# Patient Record
Sex: Female | Born: 1985 | Race: White | Hispanic: No | Marital: Married | Smoking: Never smoker
Health system: Southern US, Community
[De-identification: ages and names within clinical notes are randomized; demographics above are authoritative.]

## PROBLEM LIST (undated history)

## (undated) ENCOUNTER — Inpatient Hospital Stay (HOSPITAL_COMMUNITY): Payer: Self-pay

## (undated) DIAGNOSIS — F32A Depression, unspecified: Secondary | ICD-10-CM

## (undated) DIAGNOSIS — F419 Anxiety disorder, unspecified: Secondary | ICD-10-CM

## (undated) DIAGNOSIS — Z8619 Personal history of other infectious and parasitic diseases: Secondary | ICD-10-CM

## (undated) DIAGNOSIS — F329 Major depressive disorder, single episode, unspecified: Secondary | ICD-10-CM

## (undated) HISTORY — PX: NO PAST SURGERIES: SHX2092

## (undated) HISTORY — DX: Major depressive disorder, single episode, unspecified: F32.9

## (undated) HISTORY — DX: Personal history of other infectious and parasitic diseases: Z86.19

## (undated) HISTORY — DX: Anxiety disorder, unspecified: F41.9

## (undated) HISTORY — DX: Depression, unspecified: F32.A

---

## 2014-02-17 ENCOUNTER — Ambulatory Visit (INDEPENDENT_AMBULATORY_CARE_PROVIDER_SITE_OTHER): Payer: BC Managed Care – PPO | Admitting: Internal Medicine

## 2014-02-17 VITALS — BP 96/54 | HR 73 | Temp 97.9°F | Resp 16 | Ht 68.0 in | Wt 118.6 lb

## 2014-02-17 DIAGNOSIS — F39 Unspecified mood [affective] disorder: Secondary | ICD-10-CM

## 2014-02-17 DIAGNOSIS — Z79899 Other long term (current) drug therapy: Secondary | ICD-10-CM

## 2014-02-17 MED ORDER — TRAZODONE HCL 50 MG PO TABS: 50.0000 mg | ORAL_TABLET | Freq: Every day | ORAL | Status: DC

## 2014-02-17 MED ORDER — CITALOPRAM HYDROBROMIDE 20 MG PO TABS: 20.0000 mg | ORAL_TABLET | Freq: Every day | ORAL | Status: DC

## 2014-02-17 NOTE — Patient Instructions (Signed)
Adjustment Disorder Most changes in life can cause stress. Getting used to changes may take a few months or longer. If feelings of stress, hopelessness, or worry continue, you may have an adjustment disorder. This stress-related mental health problem may affect your feelings, thinking and how you act. It occurs in both sexes and happens at any age. SYMPTOMS  Some of the following problems may be seen and vary from person to person:  Sadness or depression.  Loss of enjoyment.  Thoughts of suicide.  Fighting.  Avoiding family and friends.  Poor school performance.  Hopelessness, sense of loss.  Trouble sleeping.  Vandalism.  Worry, weight loss or gain.  Crying spells.  Anxiety  Reckless driving.  Skipping school.  Poor work Systems analyst.  Nervousness.  Ignoring bills.  Poor attitude. DIAGNOSIS  Your caregiver will ask what has happened in your life and do a physical exam. They will make a diagnosis of an adjustment disorder when they are sure another problem or medical illness causing your feelings does not exist. TREATMENT  When problems caused by stress interfere with you daily life or last longer than a few months, you may need counseling for an adjustment disorder. Early treatment may diminish problems and help you to better cope with the stressful events in your life. Sometimes medication is necessary. Individual counseling and or support groups can be very helpful. PROGNOSIS  Adjustment disorders usually last less than 3 to 6 months. The condition may persist if there is long lasting stress. This could include health problems, relationship problems, or job difficulties where you can not easily escape from what is causing the problem. PREVENTION  Even the most mentally healthy, highly functioning people can suffer from an adjustment disorder given a significant blow from a life-changing event. There is no way to prevent pain and loss. Most people need help from time  to time. You are not alone. SEEK MEDICAL CARE IF:  Your feelings or symptoms listed above do not improve or worsen. Document Released: 01/18/2006 Document Revised: 08/08/2011 Document Reviewed: 04/11/2007 [facility] Patient Information 2015 [place], [place]. This information is not intended to replace advice given to you by your health care provider. Make sure you discuss any questions you have with your health care provider. Stress Stress-related medical problems are becoming increasingly common. The body has a built-in physical response to stressful situations. Faced with pressure, challenge or danger, we need to react quickly. Our bodies release hormones such as cortisol and adrenaline to help do this. These hormones are part of the "fight or flight" response and affect the metabolic rate, heart rate and blood pressure, resulting in a heightened, stressed state that prepares the body for optimum performance in dealing with a stressful situation. It is likely that early man required these mechanisms to stay alive, but usually modern stresses do not call for this, and the same hormones released in today's world can damage health and reduce coping ability. CAUSES  Pressure to perform at work, at school or in sports.  Threats of physical violence.  Money worries.  Arguments.  Family conflicts.  Divorce or separation from significant other.  Bereavement.  New job or unemployment.  Changes in location.  Alcohol or drug abuse. SOMETIMES, THERE IS NO PARTICULAR REASON FOR DEVELOPING STRESS. Almost all people are at risk of being stressed at some time in their lives. It is important to know that some stress is temporary and some is long term.  Temporary stress will go away when a situation is  resolved. Most people can cope with short periods of stress, and it can often be relieved by relaxing, taking a walk or getting any type of exercise, chatting through issues with friends, or having a  good night's sleep.  Chronic (long-term, continuous) stress is much harder to deal with. It can be psychologically and emotionally damaging. It can be harmful both for an individual and for friends and family. SYMPTOMS Everyone reacts to stress differently. There are some common effects that help [place] recognize it. In times of extreme stress, people may:  Shake uncontrollably.  Breathe faster and deeper than normal (hyperventilate).  Vomit.  For people with asthma, stress can trigger an attack.  For some people, stress may trigger migraine headaches, ulcers, and body pain. PHYSICAL EFFECTS OF STRESS MAY INCLUDE:  Loss of energy.  Skin problems.  Aches and pains resulting from tense muscles, including neck ache, backache and tension headaches.  Increased pain from arthritis and other conditions.  Irregular heart beat (palpitations).  Periods of irritability or anger.  Apathy or depression.  Anxiety (feeling uptight or worrying).  Unusual behavior.  Loss of appetite.  Comfort eating.  Lack of concentration.  Loss of, or decreased, sex-drive.  Increased smoking, drinking, or recreational drug use.  For women, missed periods.  Ulcers, joint pain, and muscle pain. Post-traumatic stress is the stress caused by any serious accident, strong emotional damage, or extremely difficult or violent experience such as rape or war. Post-traumatic stress victims can experience mixtures of emotions such as fear, shame, depression, guilt or anger. It may include recurrent memories or images that may be haunting. These feelings can last for weeks, months or even years after the traumatic event that triggered them. Specialized treatment, possibly with medicines and psychological therapies, is available. If stress is causing physical symptoms, severe distress or making it difficult for you to function as normal, it is worth seeing your caregiver. It is important to remember that although  stress is a usual part of life, extreme or prolonged stress can lead to other illnesses that will need treatment. It is better to visit a doctor sooner rather than later. Stress has been linked to the development of high blood pressure and heart disease, as well as insomnia and depression. There is no diagnostic test for stress since everyone reacts to it differently. But a caregiver will be able to spot the physical symptoms, such as:  Headaches.  Shingles.  Ulcers. Emotional distress such as intense worry, low mood or irritability should be detected when the doctor asks pertinent questions to identify any underlying problems that might be the cause. In case there are physical reasons for the symptoms, the doctor may also want to do some tests to exclude certain conditions. If you feel that you are suffering from stress, try to identify the aspects of your life that are causing it. Sometimes you may not be able to change or avoid them, but even a small change can have a positive ripple effect. A simple lifestyle change can make all the difference. STRATEGIES THAT CAN HELP DEAL WITH STRESS:  Delegating or sharing responsibilities.  Avoiding confrontations.  Learning to be more assertive.  Regular exercise.  Avoid using alcohol or street drugs to cope.  Eating a healthy, balanced diet, rich in fruit and vegetables and proteins.  Finding humor or absurdity in stressful situations.  Never taking on more than you know you can handle comfortably.  Organizing your time better to get as much done as possible.  Talking to friends or family and sharing your thoughts and fears.  Listening to music or relaxation tapes.  Relaxation techniques like deep breathing, meditation, and yoga.  Tensing and then relaxing your muscles, starting at the toes and working up to the head and neck. If you think that you would benefit from help, either in identifying the things that are causing your stress or  in learning techniques to help you relax, see a caregiver who is capable of helping you with this. Rather than relying on medications, it is usually better to try and identify the things in your life that are causing stress and try to deal with them. There are many techniques of managing stress including counseling, psychotherapy, aromatherapy, yoga, and exercise. Your caregiver can help you determine what is best for you. Document Released: 08/06/2002 Document Revised: 05/21/2013 Document Reviewed: 07/03/2007 [facility] Patient Information 2015 [place], [place]. This information is not intended to replace advice given to you by your health care provider. Make sure you discuss any questions you have with your health care provider.

## 2014-02-17 NOTE — Progress Notes (Signed)
   Subjective:    Patient ID: Patty Castillo, female    DOB: 04-27-1986, 28 y.o.   MRN: [id number]  HPI    Review of Systems     Objective:   Physical Exam        Assessment & Plan:

## 2014-02-17 NOTE — Progress Notes (Signed)
   Subjective:    Patient ID: Patty Castillo, female    DOB: 1985-06-19, 28 y.o.   MRN: [id number]  HPI 28 year old female. Pt came in today for a refill of her Celexa and trazodone. She takes Celexa for anxiety the Trazodone for sleep and has been taking it for 2 yrs. Her psychiatrists from [place]  Prescribed it to her 2 years ago.Pt has no complaints of sob,chest pain,fever ,coughing , chills, nausea or vomiting. Pt does not smoke or drink. Pt is up to date on all vaccines and doesn't want the flu vaccine today.   Review of Systems     Objective:   Physical Exam  Constitutional: She is oriented to person, place, and time. She appears well-developed and well-nourished.  HENT:  Head: Normocephalic.  Eyes: EOM are normal.  Neck: Normal range of motion.  Cardiovascular: Normal rate, regular rhythm and normal heart sounds.   Pulmonary/Chest: Effort normal and breath sounds normal.  Abdominal: Soft. Bowel sounds are normal.  Musculoskeletal: Normal range of motion.  Neurological: She is alert and oriented to person, place, and time. She exhibits normal muscle tone. Coordination normal.  Psychiatric: She has a normal mood and affect. Her behavior is normal. Judgment and thought content normal.          Assessment & Plan:  Stress/Adjustment RF meds

## 2014-08-21 ENCOUNTER — Other Ambulatory Visit: Payer: Self-pay | Admitting: Obstetrics and Gynecology

## 2014-08-21 ENCOUNTER — Other Ambulatory Visit: Payer: Self-pay

## 2014-08-21 DIAGNOSIS — N644 Mastodynia: Secondary | ICD-10-CM

## 2014-08-22 ENCOUNTER — Ambulatory Visit
Admission: RE | Admit: 2014-08-22 | Discharge: 2014-08-22 | Disposition: A | Discharge status: Home / Self Care | Payer: BLUE CROSS/BLUE SHIELD | Source: Ambulatory Visit | Attending: Obstetrics and Gynecology | Admitting: Obstetrics and Gynecology

## 2014-08-22 DIAGNOSIS — N644 Mastodynia: Secondary | ICD-10-CM

## 2014-12-02 LAB — OB RESULTS CONSOLE RPR: RPR: NONREACTIVE

## 2014-12-02 LAB — OB RESULTS CONSOLE HEPATITIS B SURFACE ANTIGEN: HEP B S AG: NEGATIVE

## 2014-12-02 LAB — OB RESULTS CONSOLE GC/CHLAMYDIA
Chlamydia: NEGATIVE
Gonorrhea: NEGATIVE

## 2014-12-02 LAB — OB RESULTS CONSOLE HIV ANTIBODY (ROUTINE TESTING): HIV: NONREACTIVE

## 2014-12-02 LAB — OB RESULTS CONSOLE RUBELLA ANTIBODY, IGM: Rubella: IMMUNE

## 2014-12-02 LAB — OB RESULTS CONSOLE ANTIBODY SCREEN: Antibody Screen: NEGATIVE

## 2014-12-02 LAB — OB RESULTS CONSOLE ABO/RH: RH TYPE: POSITIVE

## 2014-12-30 ENCOUNTER — Inpatient Hospital Stay (HOSPITAL_COMMUNITY)
Admission: AD | Admit: 2014-12-30 | Discharge: 2014-12-30 | Discharge status: Absent without leave | Payer: BLUE CROSS/BLUE SHIELD | Attending: Obstetrics & Gynecology | Admitting: Obstetrics & Gynecology

## 2015-04-01 ENCOUNTER — Inpatient Hospital Stay (HOSPITAL_COMMUNITY)
Admission: AD | Admit: 2015-04-01 | Discharge: 2015-04-01 | Disposition: A | Discharge status: Home / Self Care | Payer: BLUE CROSS/BLUE SHIELD | Source: Ambulatory Visit | Attending: Obstetrics & Gynecology | Admitting: Obstetrics & Gynecology

## 2015-04-01 ENCOUNTER — Encounter (HOSPITAL_COMMUNITY): Payer: Self-pay | Admitting: *Deleted

## 2015-04-01 DIAGNOSIS — A084 Viral intestinal infection, unspecified: Secondary | ICD-10-CM | POA: Diagnosis not present

## 2015-04-01 DIAGNOSIS — O212 Late vomiting of pregnancy: Secondary | ICD-10-CM | POA: Diagnosis present

## 2015-04-01 DIAGNOSIS — O4703 False labor before 37 completed weeks of gestation, third trimester: Secondary | ICD-10-CM

## 2015-04-01 DIAGNOSIS — Z3A25 25 weeks gestation of pregnancy: Secondary | ICD-10-CM | POA: Diagnosis not present

## 2015-04-01 DIAGNOSIS — O99612 Diseases of the digestive system complicating pregnancy, second trimester: Secondary | ICD-10-CM | POA: Insufficient documentation

## 2015-04-01 LAB — COMPREHENSIVE METABOLIC PANEL
ALK PHOS: 71 U/L (ref 38–126)
ALT: 12 U/L — AB (ref 14–54)
ANION GAP: 7 (ref 5–15)
AST: 24 U/L (ref 15–41)
Albumin: 3.6 g/dL (ref 3.5–5.0)
BILIRUBIN TOTAL: 0.6 mg/dL (ref 0.3–1.2)
BUN: 13 mg/dL (ref 6–20)
CALCIUM: 8.5 mg/dL — AB (ref 8.9–10.3)
CO2: 22 mmol/L (ref 22–32)
CREATININE: 0.43 mg/dL — AB (ref 0.44–1.00)
Chloride: 106 mmol/L (ref 101–111)
Glucose, Bld: 117 mg/dL — ABNORMAL HIGH (ref 65–99)
Potassium: 3.5 mmol/L (ref 3.5–5.1)
Sodium: 135 mmol/L (ref 135–145)
TOTAL PROTEIN: 5.9 g/dL — AB (ref 6.5–8.1)

## 2015-04-01 LAB — CBC
HEMATOCRIT: 34.3 % — AB (ref 36.0–46.0)
Hemoglobin: 11.7 g/dL — ABNORMAL LOW (ref 12.0–15.0)
MCH: 31.2 pg (ref 26.0–34.0)
MCHC: 34.1 g/dL (ref 30.0–36.0)
MCV: 91.5 fL (ref 78.0–100.0)
Platelets: 212 10*3/uL (ref 150–400)
RBC: 3.75 MIL/uL — ABNORMAL LOW (ref 3.87–5.11)
RDW: 12.9 % (ref 11.5–15.5)
WBC: 14.3 10*3/uL — AB (ref 4.0–10.5)

## 2015-04-01 LAB — URINALYSIS, ROUTINE W REFLEX MICROSCOPIC
Bilirubin Urine: NEGATIVE
Glucose, UA: NEGATIVE mg/dL
HGB URINE DIPSTICK: NEGATIVE
Ketones, ur: 15 mg/dL — AB
LEUKOCYTES UA: NEGATIVE
Nitrite: NEGATIVE
Protein, ur: NEGATIVE mg/dL
SPECIFIC GRAVITY, URINE: 1.025 (ref 1.005–1.030)
UROBILINOGEN UA: 0.2 mg/dL (ref 0.0–1.0)
pH: 6 (ref 5.0–8.0)

## 2015-04-01 MED ORDER — ONDANSETRON HCL 4 MG/2ML IJ SOLN
4.0000 mg | Freq: Once | INTRAMUSCULAR | Status: AC
  Administered 2015-04-01: 4 mg via INTRAVENOUS
  Filled 2015-04-01: qty 2

## 2015-04-01 MED ORDER — LACTATED RINGERS IV BOLUS (SEPSIS)
1000.0000 mL | Freq: Once | INTRAVENOUS | Status: AC
  Administered 2015-04-01: 1000 mL via INTRAVENOUS

## 2015-04-01 MED ORDER — ONDANSETRON HCL 4 MG PO TABS: 4.0000 mg | ORAL_TABLET | Freq: Four times a day (QID) | ORAL | Status: DC

## 2015-04-01 NOTE — MAU Note (Signed)
PT  SAYS  SHE HAS BEEN HURTING   WITH SHARP PAIN IN HER ABD  SINCE .  HAS BEEN VOMITING  AND  DIARRHEA.

## 2015-04-01 NOTE — MAU Provider Note (Signed)
History     CSN: [id number]  Arrival date and time: 04/01/15 4098   First Provider Initiated Contact with Patient 04/01/15 0427      No chief complaint on file.  HPI [name] is a 29 y.o. G1P0 at [redacted]w[redacted]d who presents to MAU today with complaint of N/V/D since last night. The patient states that she also has associated upper abdominal pain that is stabbing in nature. She denies lower abdominal pain, contractions, vaginal bleeding, LOF, sick contacts or complications with the pregnancy. She reports good fetal movement.  OB History    Gravida Para Term Preterm AB TAB SAB Ectopic Multiple Living   1               Past Medical History  Diagnosis Date  . Anxiety   . Depression     Past Surgical History  Procedure Laterality Date  . No past surgeries      Family History  Problem Relation Age of Onset  . Cancer Mother   . Hypertension Father   . Depression Sister   . Alzheimer's disease Maternal Grandmother   . Diabetes Maternal Grandfather   . Mental illness Paternal Grandmother   . Depression Paternal Grandmother   . Cancer Paternal Grandfather     Social History  Substance Use Topics  . Smoking status: Never Smoker   . Smokeless tobacco: None  . Alcohol Use: No    Allergies:  Allergies  Allergen Reactions  . Doxycycline Nausea And Vomiting    Prescriptions prior to admission  Medication Sig Dispense Refill Last Dose  . citalopram (CELEXA) 20 MG tablet Take 1 tablet (20 mg total) by mouth daily. 90 tablet 3 03/31/2015 at Unknown time  . prenatal vitamin w/FE, FA (PRENATAL 1 + 1) 27-1 MG TABS tablet Take 1 tablet by mouth daily at 12 noon.   03/31/2015 at Unknown time  . traZODone (DESYREL) 50 MG tablet Take 1 tablet (50 mg total) by mouth at bedtime. 90 tablet 3     Review of Systems  Constitutional: Negative for fever and malaise/fatigue.  Gastrointestinal: Positive for nausea, vomiting, abdominal pain and diarrhea. Negative for  constipation.  Genitourinary:       Neg -vaginal bleeding, LOF + discharge   Physical Exam   Blood pressure 105/70, pulse 119, temperature 98.9 F (37.2 C), temperature source Oral, resp. rate 20, height  (1.651 m), weight 134 lb 2 oz (60.839 kg), SpO2 98 %.  Physical Exam  Nursing note and vitals reviewed. Constitutional: She is oriented to person, place, and time. She appears well-developed and well-nourished. No distress.  HENT:  Head: Normocephalic and atraumatic.  Cardiovascular: Normal rate.   Respiratory: Effort normal.  GI: Soft. She exhibits no distension and no mass. There is no tenderness. There is no rebound and no guarding.  Neurological: She is alert and oriented to person, place, and time.  Skin: Skin is warm and dry. No erythema.  Psychiatric: She has a normal mood and affect.  Dilation: Closed Cervical Position: Posterior Exam by:: Davonne Jarnigan,PA  Results for orders placed or performed during the hospital encounter of 04/01/15 (from the past 24 hour(s))  Urinalysis, Routine w reflex microscopic (not at [facility])     Status: Abnormal   Collection Time: 04/01/15  4:15 AM  Result Value Ref Range   Color, Urine YELLOW YELLOW   APPearance CLEAR CLEAR   Specific Gravity, Urine 1.025 1.005 - 1.030   pH 6.0 5.0 - 8.0  Glucose, UA NEGATIVE NEGATIVE mg/dL   Hgb urine dipstick NEGATIVE NEGATIVE   Bilirubin Urine NEGATIVE NEGATIVE   Ketones, ur 15 (A) NEGATIVE mg/dL   Protein, ur NEGATIVE NEGATIVE mg/dL   Urobilinogen, UA 0.2 0.0 - 1.0 mg/dL   Nitrite NEGATIVE NEGATIVE   Leukocytes, UA NEGATIVE NEGATIVE  CBC     Status: Abnormal   Collection Time: 04/01/15  4:25 AM  Result Value Ref Range   WBC 14.3 (H) 4.0 - 10.5 K/uL   RBC 3.75 (L) 3.87 - 5.11 MIL/uL   Hemoglobin 11.7 (L) 12.0 - 15.0 g/dL   HCT 11.934.3 (L) 14.736.0 - 82.946.0 %   MCV 91.5 78.0 - 100.0 fL   MCH 31.2 26.0 - 34.0 pg   MCHC 34.1 30.0 - 36.0 g/dL   RDW 56.212.9 13.011.5 - 86.515.5 %   Platelets 212 150 - 400 K/uL   Comprehensive metabolic panel     Status: Abnormal   Collection Time: 04/01/15  4:25 AM  Result Value Ref Range   Sodium 135 135 - 145 mmol/L   Potassium 3.5 3.5 - 5.1 mmol/L   Chloride 106 101 - 111 mmol/L   CO2 22 22 - 32 mmol/L   Glucose, Bld 117 (H) 65 - 99 mg/dL   BUN 13 6 - 20 mg/dL   Creatinine, Ser 7.840.43 (L) 0.44 - 1.00 mg/dL   Calcium 8.5 (L) 8.9 - 10.3 mg/dL   Total Protein 5.9 (L) 6.5 - 8.1 g/dL   Albumin 3.6 3.5 - 5.0 g/dL   AST 24 15 - 41 U/L   ALT 12 (L) 14 - 54 U/L   Alkaline Phosphatase 71 38 - 126 U/L   Total Bilirubin 0.6 0.3 - 1.2 mg/dL   GFR calc non Af Amer >60 >60 mL/min   GFR calc Af Amer >60 >60 mL/min   Anion gap 7 5 - 15    Fetal Monitoring: Baseline: 140 bpm, moderate variability, + accelerations, no decelerations Contractions: q 4 minutes initially After IV fluid x 1 liter Contractions: q 5 minutes, mild After IV fluids x 2 liters Contractions: irregular, can be as often as q 6 minutes. Still non-palpable to patient.   MAU Course  Procedures None  MDM UA, CBC and CMP today 1 liter IV LR bolus with 4 mg Zofran IV given Discussed patient including labs and EFM with Dr. Langston MaskerMorris. She agrees with plan to continue IV hydration and monitoring at this time. If patient continues to improve she may be discharged with Rx for Zofran.  Second liter LR given  Patient reports significant improvement in N/V and abdominal pain Contractions spaced out with IV fluids, but have not resolved. Cervix is closed, firm and posterior.  Assessment and Plan  A: SIUP at [id number] Viral gastroenteritis  P: Discharge home Rx for Zofran given to patient Preterm labor precautions discussed Patient advised to follow-up with [facility] as scheduled for routine prenatal care or sooner PRN Patient may return to MAU as needed or if her condition were to change or worsen   Marny LowensteinJulie N Tysin Salada, PA-C  04/01/2015, 5:05 AM

## 2015-04-01 NOTE — Discharge Instructions (Signed)
Food Choices to Help Relieve Diarrhea, Adult °When you have diarrhea, the foods you eat and your eating habits are very important. Choosing the right foods and drinks can help relieve diarrhea. Also, because diarrhea can last up to 7 days, you need to replace lost fluids and electrolytes (such as sodium, potassium, and chloride) in order to help prevent dehydration.  °WHAT GENERAL GUIDELINES DO I NEED TO FOLLOW? °· Slowly drink 1 cup (8 oz) of fluid for each episode of diarrhea. If you are getting enough fluid, your urine will be clear or pale yellow. °· Eat starchy foods. Some good choices include white rice, white toast, pasta, low-fiber cereal, baked potatoes (without the skin), saltine crackers, and bagels. °· Avoid large servings of any cooked vegetables. °· Limit fruit to two servings per day. A serving is ½ cup or 1 small piece. °· Choose foods with less than 2 g of fiber per serving. °· Limit fats to less than 8 tsp (38 g) per day. °· Avoid fried foods. °· Eat foods that have probiotics in them. Probiotics can be found in certain dairy products. °· Avoid foods and beverages that may increase the speed at which food moves through the stomach and intestines (gastrointestinal tract). Things to avoid include: °¨ High-fiber foods, such as dried fruit, raw fruits and vegetables, nuts, seeds, and whole grain foods. °¨ Spicy foods and high-fat foods. °¨ Foods and beverages sweetened with high-fructose corn syrup, honey, or sugar alcohols such as xylitol, sorbitol, and mannitol. °WHAT FOODS ARE RECOMMENDED? °Grains °White rice. White, French, or pita breads (fresh or toasted), including plain rolls, buns, or bagels. White pasta. Saltine, soda, or graham crackers. Pretzels. Low-fiber cereal. Cooked cereals made with water (such as cornmeal, farina, or cream cereals). Plain muffins. Matzo. Melba toast. Zwieback.  °Vegetables °Potatoes (without the skin). Strained tomato and vegetable juices. Most well-cooked and canned  vegetables without seeds. Tender lettuce. °Fruits °Cooked or canned applesauce, apricots, cherries, fruit cocktail, grapefruit, peaches, pears, or plums. Fresh bananas, apples without skin, cherries, grapes, cantaloupe, grapefruit, peaches, oranges, or plums.  °Meat and Other Protein Products °Baked or boiled chicken. Eggs. Tofu. Fish. Seafood. Smooth peanut butter. Ground or well-cooked tender beef, ham, veal, lamb, pork, or poultry.  °Dairy °Plain yogurt, kefir, and unsweetened liquid yogurt. Lactose-free milk, buttermilk, or soy milk. Plain hard cheese. °Beverages °Sport drinks. Clear broths. Diluted fruit juices (except prune). Regular, caffeine-free sodas such as ginger ale. Water. Decaffeinated teas. Oral rehydration solutions. Sugar-free beverages not sweetened with sugar alcohols. °Other °Bouillon, broth, or soups made from recommended foods.  °The items listed above may not be a complete list of recommended foods or beverages. Contact your dietitian for more options. °WHAT FOODS ARE NOT RECOMMENDED? °Grains °Whole grain, whole wheat, bran, or rye breads, rolls, pastas, crackers, and cereals. Wild or brown rice. Cereals that contain more than 2 g of fiber per serving. Corn tortillas or taco shells. Cooked or dry oatmeal. Granola. Popcorn. °Vegetables °Raw vegetables. Cabbage, broccoli, Brussels sprouts, artichokes, baked beans, beet greens, corn, kale, legumes, peas, sweet potatoes, and yams. Potato skins. Cooked spinach and cabbage. °Fruits °Dried fruit, including raisins and dates. Raw fruits. Stewed or dried prunes. Fresh apples with skin, apricots, mangoes, pears, raspberries, and strawberries.  °Meat and Other Protein Products °Chunky peanut butter. Nuts and seeds. Beans and lentils. Bacon.  °Dairy °High-fat cheeses. Milk, chocolate milk, and beverages made with milk, such as milk shakes. Cream. Ice cream. °Sweets and Desserts °Sweet rolls, doughnuts, and sweet breads.   Pancakes and waffles. °Fats and  Oils °Butter. Cream sauces. Margarine. Salad oils. Plain salad dressings. Olives. Avocados.  °Beverages °Caffeinated beverages (such as coffee, tea, soda, or energy drinks). Alcoholic beverages. Fruit juices with pulp. Prune juice. Soft drinks sweetened with high-fructose corn syrup or sugar alcohols. °Other °Coconut. Hot sauce. Chili powder. Mayonnaise. Gravy. Cream-based or milk-based soups.  °The items listed above may not be a complete list of foods and beverages to avoid. Contact your dietitian for more information. °WHAT SHOULD I DO IF I BECOME DEHYDRATED? °Diarrhea can sometimes lead to dehydration. Signs of dehydration include dark urine and dry mouth and skin. If you think you are dehydrated, you should rehydrate with an oral rehydration solution. These solutions can be purchased at pharmacies, retail stores, or online.  °Drink ½-1 cup (120-240 mL) of oral rehydration solution each time you have an episode of diarrhea. If drinking this amount makes your diarrhea worse, try drinking smaller amounts more often. For example, drink 1-3 tsp (5-15 mL) every 5-10 minutes.  °A general rule for staying hydrated is to drink 1½-2 L of fluid per day. Talk to your health care provider about the specific amount you should be drinking each day. Drink enough fluids to keep your urine clear or pale yellow. °  °This information is not intended to replace advice given to you by your health care provider. Make sure you discuss any questions you have with your health care provider. °  °Document Released: 08/06/2003 Document Revised: 06/06/2014 Document Reviewed: 04/08/2013 °Elsevier Interactive Patient Education ©2016 [employer] ° ° °Viral Gastroenteritis °Viral gastroenteritis is also called stomach flu. This illness is caused by a certain type of germ (virus). It can cause sudden watery poop (diarrhea) and throwing up (vomiting). This can cause you to lose body fluids (dehydration). This illness usually lasts for 3 to 8  days. It usually goes away on its own. °HOME CARE  °· Drink enough fluids to keep your pee (urine) clear or pale yellow. Drink small amounts of fluids often. °· Ask your doctor how to replace body fluid losses (rehydration). °· Avoid: °¨ Foods high in sugar. °¨ Alcohol. °¨ Bubbly (carbonated) drinks. °¨ Tobacco. °¨ Juice. °¨ Caffeine drinks. °¨ Very hot or cold fluids. °¨ Fatty, greasy foods. °¨ Eating too much at one time. °¨ Dairy products until 24 to 48 hours after your watery poop stops. °· You may eat foods with active cultures (probiotics). They can be found in some yogurts and supplements. °· Wash your hands well to avoid spreading the illness. °· Only take medicines as told by your doctor. Do not give aspirin to children. Do not take medicines for watery poop (antidiarrheals). °· Ask your doctor if you should keep taking your regular medicines. °· Keep all doctor visits as told. °GET HELP RIGHT AWAY IF:  °· You cannot keep fluids down. °· You do not pee at least once every 6 to 8 hours. °· You are short of breath. °· You see blood in your poop or throw up. This may look like coffee grounds. °· You have belly (abdominal) pain that gets worse or is just in one small spot (localized). °· You keep throwing up or having watery poop. °· You have a fever. °· The patient is a child younger than 3 months, and he or she has a fever. °· The patient is a child older than 3 months, and he or she has a fever and problems that do not go away. °· The   patient is a child older than 3 months, and he or she has a fever and problems that suddenly get worse. °· The patient is a baby, and he or she has no tears when crying. °MAKE SURE YOU:  °· Understand these instructions. °· Will watch your condition. °· Will get help right away if you are not doing well or get worse. °  °This information is not intended to replace advice given to you by your health care provider. Make sure you discuss any questions you have with your health  care provider. °  °Document Released: 11/02/2007 Document Revised: 08/08/2011 Document Reviewed: 03/02/2011 °Elsevier Interactive Patient Education ©2016 [employer] ° °

## 2015-07-08 ENCOUNTER — Encounter (HOSPITAL_COMMUNITY): Payer: Self-pay | Admitting: *Deleted

## 2015-07-08 ENCOUNTER — Telehealth (HOSPITAL_COMMUNITY): Payer: Self-pay | Admitting: *Deleted

## 2015-07-08 LAB — OB RESULTS CONSOLE GBS: STREP GROUP B AG: NEGATIVE

## 2015-07-08 NOTE — Telephone Encounter (Signed)
Preadmission screen  

## 2015-07-11 ENCOUNTER — Encounter (HOSPITAL_COMMUNITY): Payer: Self-pay | Admitting: *Deleted

## 2015-07-11 ENCOUNTER — Inpatient Hospital Stay (HOSPITAL_COMMUNITY)
Admission: AD | Admit: 2015-07-11 | Discharge: 2015-07-11 | Disposition: A | Discharge status: Home / Self Care | Payer: BLUE CROSS/BLUE SHIELD | Source: Ambulatory Visit | Attending: Obstetrics and Gynecology | Admitting: Obstetrics and Gynecology

## 2015-07-11 NOTE — MAU Note (Signed)
Pt c/o contractions since this morning. Denies vag bleeding or leaking.

## 2015-07-12 ENCOUNTER — Inpatient Hospital Stay (HOSPITAL_COMMUNITY): Payer: BLUE CROSS/BLUE SHIELD | Admitting: Anesthesiology

## 2015-07-12 ENCOUNTER — Inpatient Hospital Stay (HOSPITAL_COMMUNITY): Payer: BLUE CROSS/BLUE SHIELD

## 2015-07-12 ENCOUNTER — Encounter (HOSPITAL_COMMUNITY): Payer: Self-pay

## 2015-07-12 ENCOUNTER — Inpatient Hospital Stay (HOSPITAL_COMMUNITY)
Admission: AD | Admit: 2015-07-12 | Discharge: 2015-07-14 | DRG: 775 | Disposition: A | Discharge status: Home / Self Care | Payer: BLUE CROSS/BLUE SHIELD | Source: Ambulatory Visit | Attending: Obstetrics and Gynecology | Admitting: Obstetrics and Gynecology

## 2015-07-12 DIAGNOSIS — Z8249 Family history of ischemic heart disease and other diseases of the circulatory system: Secondary | ICD-10-CM | POA: Diagnosis not present

## 2015-07-12 DIAGNOSIS — Z833 Family history of diabetes mellitus: Secondary | ICD-10-CM

## 2015-07-12 DIAGNOSIS — Z3A40 40 weeks gestation of pregnancy: Secondary | ICD-10-CM | POA: Diagnosis not present

## 2015-07-12 DIAGNOSIS — Z820 Family history of epilepsy and other diseases of the nervous system: Secondary | ICD-10-CM

## 2015-07-12 DIAGNOSIS — O329XX1 Maternal care for malpresentation of fetus, unspecified, fetus 1: Secondary | ICD-10-CM

## 2015-07-12 LAB — CBC
HCT: 34 % — ABNORMAL LOW (ref 36.0–46.0)
Hemoglobin: 11.6 g/dL — ABNORMAL LOW (ref 12.0–15.0)
MCH: 31.1 pg (ref 26.0–34.0)
MCHC: 34.1 g/dL (ref 30.0–36.0)
MCV: 91.2 fL (ref 78.0–100.0)
PLATELETS: 185 10*3/uL (ref 150–400)
RBC: 3.73 MIL/uL — AB (ref 3.87–5.11)
RDW: 12.9 % (ref 11.5–15.5)
WBC: 12.2 10*3/uL — AB (ref 4.0–10.5)

## 2015-07-12 LAB — ABO/RH: ABO/RH(D): O POS

## 2015-07-12 LAB — TYPE AND SCREEN
ABO/RH(D): O POS
ANTIBODY SCREEN: NEGATIVE

## 2015-07-12 LAB — RPR: RPR: NONREACTIVE

## 2015-07-12 MED ORDER — LACTATED RINGERS IV SOLN
500.0000 mL | INTRAVENOUS | Status: DC | PRN
  Administered 2015-07-12: 500 mL via INTRAVENOUS

## 2015-07-12 MED ORDER — MEDROXYPROGESTERONE ACETATE 150 MG/ML IM SUSP: 150.0000 mg | INTRAMUSCULAR | Status: DC | PRN

## 2015-07-12 MED ORDER — OXYCODONE-ACETAMINOPHEN 5-325 MG PO TABS: 2.0000 | ORAL_TABLET | ORAL | Status: DC | PRN

## 2015-07-12 MED ORDER — LACTATED RINGERS IV SOLN: 500.0000 mL | Freq: Once | INTRAVENOUS | Status: DC

## 2015-07-12 MED ORDER — EPHEDRINE 5 MG/ML INJ
10.0000 mg | INTRAVENOUS | Status: DC | PRN
Start: 2015-07-12 — End: 2015-07-12
  Filled 2015-07-12: qty 2

## 2015-07-12 MED ORDER — ZOLPIDEM TARTRATE 5 MG PO TABS: 5.0000 mg | ORAL_TABLET | Freq: Every evening | ORAL | Status: DC | PRN

## 2015-07-12 MED ORDER — OXYTOCIN BOLUS FROM INFUSION: 500.0000 mL | INTRAVENOUS | Status: DC

## 2015-07-12 MED ORDER — OXYTOCIN 10 UNIT/ML IJ SOLN
1.0000 m[IU]/min | INTRAVENOUS | Status: DC
  Administered 2015-07-12: 2 m[IU]/min via INTRAVENOUS

## 2015-07-12 MED ORDER — ACETAMINOPHEN 325 MG PO TABS: 650.0000 mg | ORAL_TABLET | ORAL | Status: DC | PRN

## 2015-07-12 MED ORDER — IBUPROFEN 600 MG PO TABS
600.0000 mg | ORAL_TABLET | Freq: Four times a day (QID) | ORAL | Status: DC
  Administered 2015-07-12 – 2015-07-14 (×6): 600 mg via ORAL
  Filled 2015-07-12 (×6): qty 1

## 2015-07-12 MED ORDER — DIPHENHYDRAMINE HCL 25 MG PO CAPS: 25.0000 mg | ORAL_CAPSULE | Freq: Four times a day (QID) | ORAL | Status: DC | PRN

## 2015-07-12 MED ORDER — LIDOCAINE HCL (PF) 1 % IJ SOLN
30.0000 mL | INTRAMUSCULAR | Status: DC | PRN
  Filled 2015-07-12: qty 30

## 2015-07-12 MED ORDER — CITRIC ACID-SODIUM CITRATE 334-500 MG/5ML PO SOLN: 30.0000 mL | ORAL | Status: DC | PRN

## 2015-07-12 MED ORDER — PHENYLEPHRINE 40 MCG/ML (10ML) SYRINGE FOR IV PUSH (FOR BLOOD PRESSURE SUPPORT)
80.0000 ug | PREFILLED_SYRINGE | INTRAVENOUS | Status: DC | PRN
  Filled 2015-07-12: qty 2

## 2015-07-12 MED ORDER — LACTATED RINGERS IV SOLN
INTRAVENOUS | Status: DC
  Administered 2015-07-12: 11:00:00 via INTRAVENOUS

## 2015-07-12 MED ORDER — DIPHENHYDRAMINE HCL 50 MG/ML IJ SOLN: 12.5000 mg | INTRAMUSCULAR | Status: DC | PRN

## 2015-07-12 MED ORDER — EPHEDRINE 5 MG/ML INJ
10.0000 mg | INTRAVENOUS | Status: DC | PRN
  Filled 2015-07-12: qty 2

## 2015-07-12 MED ORDER — BENZOCAINE-MENTHOL 20-0.5 % EX AERO
1.0000 "application " | INHALATION_SPRAY | CUTANEOUS | Status: DC | PRN
  Administered 2015-07-12: 1 via TOPICAL
  Filled 2015-07-12 (×2): qty 56

## 2015-07-12 MED ORDER — OXYCODONE-ACETAMINOPHEN 5-325 MG PO TABS: 1.0000 | ORAL_TABLET | ORAL | Status: DC | PRN

## 2015-07-12 MED ORDER — ONDANSETRON HCL 4 MG PO TABS
4.0000 mg | ORAL_TABLET | ORAL | Status: DC | PRN
  Administered 2015-07-12: 4 mg via ORAL
  Filled 2015-07-12: qty 1

## 2015-07-12 MED ORDER — PHENYLEPHRINE 40 MCG/ML (10ML) SYRINGE FOR IV PUSH (FOR BLOOD PRESSURE SUPPORT)
80.0000 ug | PREFILLED_SYRINGE | INTRAVENOUS | Status: DC | PRN
  Filled 2015-07-12: qty 20
  Filled 2015-07-12: qty 2

## 2015-07-12 MED ORDER — WITCH HAZEL-GLYCERIN EX PADS
1.0000 "application " | MEDICATED_PAD | CUTANEOUS | Status: DC | PRN
  Administered 2015-07-12: 1 via TOPICAL

## 2015-07-12 MED ORDER — FLEET ENEMA 7-19 GM/118ML RE ENEM: 1.0000 | ENEMA | RECTAL | Status: DC | PRN

## 2015-07-12 MED ORDER — PRENATAL MULTIVITAMIN CH
1.0000 | ORAL_TABLET | Freq: Every day | ORAL | Status: DC
  Filled 2015-07-12: qty 1

## 2015-07-12 MED ORDER — SIMETHICONE 80 MG PO CHEW: 80.0000 mg | CHEWABLE_TABLET | ORAL | Status: DC | PRN

## 2015-07-12 MED ORDER — ONDANSETRON HCL 4 MG/2ML IJ SOLN: 4.0000 mg | INTRAMUSCULAR | Status: DC | PRN

## 2015-07-12 MED ORDER — BUTORPHANOL TARTRATE 1 MG/ML IJ SOLN
1.0000 mg | INTRAMUSCULAR | Status: DC | PRN
  Filled 2015-07-12: qty 1

## 2015-07-12 MED ORDER — DIBUCAINE 1 % RE OINT
1.0000 "application " | TOPICAL_OINTMENT | RECTAL | Status: DC | PRN
  Administered 2015-07-12: 1 via RECTAL
  Filled 2015-07-12: qty 28

## 2015-07-12 MED ORDER — MEASLES, MUMPS & RUBELLA VAC ~~LOC~~ INJ
0.5000 mL | INJECTION | Freq: Once | SUBCUTANEOUS | Status: DC
  Filled 2015-07-12: qty 0.5

## 2015-07-12 MED ORDER — LIDOCAINE HCL (PF) 1 % IJ SOLN
INTRAMUSCULAR | Status: DC | PRN
  Administered 2015-07-12: 9 mL via EPIDURAL
  Administered 2015-07-12: 7 mL via EPIDURAL

## 2015-07-12 MED ORDER — LANOLIN HYDROUS EX OINT: TOPICAL_OINTMENT | CUTANEOUS | Status: DC | PRN

## 2015-07-12 MED ORDER — ONDANSETRON HCL 4 MG/2ML IJ SOLN: 4.0000 mg | Freq: Four times a day (QID) | INTRAMUSCULAR | Status: DC | PRN

## 2015-07-12 MED ORDER — OXYTOCIN 10 UNIT/ML IJ SOLN
2.5000 [IU]/h | INTRAVENOUS | Status: DC
  Filled 2015-07-12: qty 4

## 2015-07-12 MED ORDER — SENNOSIDES-DOCUSATE SODIUM 8.6-50 MG PO TABS
2.0000 | ORAL_TABLET | ORAL | Status: DC
  Administered 2015-07-12 – 2015-07-13 (×2): 2 via ORAL
  Filled 2015-07-12 (×2): qty 2

## 2015-07-12 MED ORDER — FENTANYL 2.5 MCG/ML BUPIVACAINE 1/10 % EPIDURAL INFUSION (WH - ANES)
14.0000 mL/h | INTRAMUSCULAR | Status: DC | PRN
  Administered 2015-07-12 (×2): 14 mL/h via EPIDURAL
  Filled 2015-07-12 (×2): qty 125

## 2015-07-12 MED ORDER — CITALOPRAM HYDROBROMIDE 40 MG PO TABS
40.0000 mg | ORAL_TABLET | Freq: Every day | ORAL | Status: DC
  Administered 2015-07-13: 40 mg via ORAL
  Filled 2015-07-12 (×2): qty 1

## 2015-07-12 MED ORDER — TERBUTALINE SULFATE 1 MG/ML IJ SOLN
0.2500 mg | Freq: Once | INTRAMUSCULAR | Status: DC | PRN
  Filled 2015-07-12: qty 1

## 2015-07-12 MED ORDER — TETANUS-DIPHTH-ACELL PERTUSSIS 5-2.5-18.5 LF-MCG/0.5 IM SUSP: 0.5000 mL | Freq: Once | INTRAMUSCULAR | Status: DC

## 2015-07-12 MED ORDER — HYDROCODONE-ACETAMINOPHEN 5-325 MG PO TABS
1.0000 | ORAL_TABLET | ORAL | Status: DC | PRN
  Administered 2015-07-12 – 2015-07-13 (×3): 2 via ORAL
  Administered 2015-07-13: 1 via ORAL
  Administered 2015-07-13: 2 via ORAL
  Administered 2015-07-14: 1 via ORAL
  Administered 2015-07-14: 2 via ORAL
  Filled 2015-07-12: qty 2
  Filled 2015-07-12: qty 1
  Filled 2015-07-12: qty 2
  Filled 2015-07-12: qty 1
  Filled 2015-07-12 (×3): qty 2

## 2015-07-12 NOTE — Progress Notes (Signed)
Pt initially refused IV, notified Dr Henderson Cloud and he prefers for her to have at least a saline lock and to ask her again.  Spoke to pt and she agreed to saline lock and labs.

## 2015-07-12 NOTE — Anesthesia Procedure Notes (Signed)
Epidural Patient location during procedure: OB Start time: 07/12/2015 8:16 AM End time: 07/12/2015 8:20 AM  Staffing Anesthesiologist: Leilani Able Performed by: anesthesiologist   Preanesthetic Checklist Completed: patient identified, surgical consent, pre-op evaluation, timeout performed, IV checked, risks and benefits discussed and monitors and equipment checked  Epidural Patient position: sitting Prep: site prepped and draped and DuraPrep Patient monitoring: continuous pulse ox and blood pressure Approach: midline Location: L3-L4 Injection technique: LOR air  Needle:  Needle type: Tuohy  Needle gauge: 17 G Needle length: 9 cm and 9 Needle insertion depth: 6 cm Catheter type: closed end flexible Catheter size: 19 Gauge Catheter at skin depth: 11 cm Test dose: negative and Other  Assessment Sensory level: T9 Events: blood not aspirated, injection not painful, no injection resistance, negative IV test and no paresthesia  Additional Notes Reason for block:procedure for pain

## 2015-07-12 NOTE — H&P (Addendum)
Patty Castillo is a 30 y.o. female G1P0 @ 40+1 wks presenting for SOL.  No LOF or VB.  History OB History    Gravida Para Term Preterm AB TAB SAB Ectopic Multiple Living   1              Past Medical History  Diagnosis Date  . Anxiety   . Depression   . Hx of varicella    Past Surgical History  Procedure Laterality Date  . No past surgeries     Family History: family history includes Alzheimer's disease in her maternal grandmother; Cancer in her mother, paternal grandfather, and paternal grandmother; Depression in her paternal grandmother and sister; Diabetes in her maternal grandfather; Hypertension in her father; Hypothyroidism in her maternal aunt and mother; Mental illness in her paternal grandmother. Social History:  reports that she has never smoked. She has never used smokeless tobacco. She reports that she does not drink alcohol or use illicit drugs.   Prenatal Transfer Tool  Maternal Diabetes: No Genetic Screening: Declined Maternal Ultrasounds/Referrals: Normal Fetal Ultrasounds or other Referrals:  None Maternal Substance Abuse:  No Significant Maternal Medications:  Celexa /d Significant Maternal Lab Results:  None Other Comments:  None  ROS  Dilation: 4 Effacement (%): 90 Station: -1 Exam by:: Misty Stanley Leftwich-Kirby CNM Blood pressure 120/77, pulse 78, temperature 98.4 F (36.9 C), temperature source Oral, resp. rate 20, height  (1.702 m), weight 145 lb (65.772 kg), SpO2 98 %. Exam Physical Exam  Gen - NAD abd - gravid, NT Cvx 4cm AROM - clear Prenatal labs: ABO, Rh: --/--/O POS (02/12 0530) Antibody: NEG (02/12 0530) Rubella: Immune (07/05 0000) RPR: Nonreactive (07/05 0000)Patty Rappa05 0000)  HIV: Non-reactive (07/05 0000)  GBS: Negative (02/08 0000)   Assessment/Plan: Admit Exp mngt Epidural   Patty Castillo 07/12/2015, 8:54 AM

## 2015-07-12 NOTE — Anesthesia Preprocedure Evaluation (Signed)
Anesthesia Evaluation  Patient identified by MRN, date of birth, ID band Patient awake    Reviewed: Allergy & Precautions, H&P , NPO status , Patient's Chart, lab work & pertinent test results  Airway Mallampati: I  TM Distance: >3 FB Neck ROM: full    Dental no notable dental hx.    Pulmonary neg pulmonary ROS,    Pulmonary exam normal        Cardiovascular negative cardio ROS Normal cardiovascular exam     Neuro/Psych negative neurological ROS     GI/Hepatic negative GI ROS, Neg liver ROS,   Endo/Other  negative endocrine ROS  Renal/GU negative Renal ROS     Musculoskeletal   Abdominal Normal abdominal exam  (+)   Peds  Hematology negative hematology ROS (+)   Anesthesia Other Findings   Reproductive/Obstetrics (+) Pregnancy                             Anesthesia Physical Anesthesia Plan  ASA: II  Anesthesia Plan: Epidural   Post-op Pain Management:    Induction:   Airway Management Planned:   Additional Equipment:   Intra-op Plan:   Post-operative Plan:   Informed Consent: I have reviewed the patients History and Physical, chart, labs and discussed the procedure including the risks, benefits and alternatives for the proposed anesthesia with the patient or authorized representative who has indicated his/her understanding and acceptance.     Plan Discussed with:   Anesthesia Plan Comments:         Anesthesia Quick Evaluation  

## 2015-07-12 NOTE — Consults (Signed)
  Anesthesia Pain Consult Note  Patient: Patty Castillo, 30 y.o., female  Consult Requested by: Harold Hedge, MD  Reason for Consult: CRNA Pain Consult Rounding  Level of Consciousness: alert  Pain: 1 /10    Pain Goal:  5/10   Last Vitals:  Filed Vitals:   07/12/15 0905 07/12/15 0931  BP: 113/73 109/69  Pulse: 94 68  Temp: 36.8 C   Resp: 17     Plan: Epidural infusion for pain control. Placed and infusing The patient states that the epidural is meeting her pain goal  expectations..  Risks of wet tap, epidural hematoma and spinal cord injury explained to:   Consent:Risks of procedure as well as the alternatives and risks of each were explained to the (patient/caregiver).  Consent for procedure obtained.  Allergies  Allergen Reactions  . Doxycycline Nausea And Vomiting    Physical exam: PULM normal  CARDIO Heart sounds are normal.  Regular rate and rhythm without murmur, gallop or rub.  OTHER    I have reviewed the patient's medications listed below. . lactated ringers  500 mL Intravenous Once   . fentaNYL 2.5 mcg/ml w/bupivacaine 1/10% in NS epidural infusion ( total) 14 mL/hr (07/12/15 1610)  . lactated ringers 125 mL/hr at 07/12/15 0820  . oxytocin    . oxytocin 40 units in LR 1000 mL     acetaminophen, butorphanol, citric acid-sodium citrate, diphenhydrAMINE, ePHEDrine, ePHEDrine, fentaNYL 2.5 mcg/ml w/bupivacaine 1/10% in NS epidural infusion ( total), lactated ringers, lidocaine (PF), ondansetron, oxyCODONE-acetaminophen, oxyCODONE-acetaminophen, phenylephrine, phenylephrine, sodium phosphate  Past Medical History  Diagnosis Date  . Anxiety   . Depression   . Hx of varicella    Past Surgical History  Procedure Laterality Date  . No past surgeries      reports that she has never smoked. She has never used smokeless tobacco. She reports that she does not drink alcohol or use illicit drugs.     Less Woolsey 07/12/2015

## 2015-07-12 NOTE — Progress Notes (Signed)
Pt resting.  Comfortable w/ epidural  FHT reassuring, cat 1 Toco Q2 Cvx 7/C/+1  A/P:  Continue exp mngt

## 2015-07-12 NOTE — Lactation Note (Signed)
This note was copied from a baby's chart. Lactation Consultation Note  Patient Name: Patty Castillo [id number]'U Date: 07/12/2015 Reason for consult: Initial assessment Baby at 3 hr of life and mom has bruised nipples/areola along with areolar edema. Given shells. Mom does have short shaft nipples but her breast are easily compressible. The L nipple and areola does have bruising and the R nipple tip looks bruised. Mom denies breast or nipple pain. Demonstrated manual expression, colostrum noted bilaterally, spoon in room. Discussed baby behavior, feeding frequency, baby belly size, voids, wt loss, breast changes, and nipple care. Given lactation handouts. Aware of OP services and support support.    Maternal Data Has patient been taught Hand Expression?: Yes Does the patient have breastfeeding experience prior to this delivery?: No  Feeding Feeding Type: Breast Fed Length of feed: 15 min  LATCH Score/Interventions Latch: Repeated attempts needed to sustain latch, nipple held in mouth throughout feeding, stimulation needed to elicit sucking reflex. Intervention(s): Adjust position;Assist with latch;Breast compression  Audible Swallowing: Spontaneous and intermittent Intervention(s): Skin to skin;Hand expression;Alternate breast massage  Type of Nipple: Everted at rest and after stimulation  Comfort (Breast/Nipple): Filling, red/small blisters or bruises, mild/mod discomfort  Problem noted: Cracked, bleeding, blisters, bruises Interventions  (Cracked/bleeding/bruising/blister): Expressed breast milk to nipple  Hold (Positioning): Assistance needed to correctly position infant at breast and maintain latch. Intervention(s): Position options;Support Pillows  LATCH Score: 7  Lactation Tools Discussed/Used WIC Program: No   Consult Status Consult Status: Follow-up Date: 07/13/15 Follow-up type: In-patient    Rulon Eisenmenger 07/12/2015, 9:41 PM

## 2015-07-12 NOTE — MAU Note (Signed)
Was here earlier but contractions got worse and more frequent.  Mucus plug came out.  No leaking.  Baby moving well.

## 2015-07-12 NOTE — Progress Notes (Signed)
Pt comfortable  FHT cat 1 Toco Q2-4 Cvx 7-8/C/+1 IUPC placed  A/P:  Continue exp mngt

## 2015-07-12 NOTE — Lactation Note (Signed)
This note was copied from a baby's chart. Lactation Consultation Note  Patient Name: Patty Castillo Date: 07/12/2015 Reason for consult: Follow-up assessment Baby at 4 hr of life and mom was having a hard time getting a deep latch. Showed mom how to compress the breast and how to bring baby closer to the breast. Mom was manually expressing and spoon feeding upon entry because she felt like the baby was making the bruising worse. Encouraged mom to keep trying. FOB is present and very supportive. Mom is very tiered and would like to take a nap after the baby is done eating. Suggested that FOB help care for baby while mom is resting.   Maternal Data Has patient been taught Hand Expression?: Yes Does the patient have breastfeeding experience prior to this delivery?: No  Feeding Feeding Type: Breast Fed Length of feed: 15 min  LATCH Score/Interventions Latch: Repeated attempts needed to sustain latch, nipple held in mouth throughout feeding, stimulation needed to elicit sucking reflex. Intervention(s): Adjust position;Assist with latch;Breast compression  Audible Swallowing: Spontaneous and intermittent Intervention(s): Skin to skin  Type of Nipple: Everted at rest and after stimulation  Comfort (Breast/Nipple): Filling, red/small blisters or bruises, mild/mod discomfort  Problem noted: Cracked, bleeding, blisters, bruises Interventions  (Cracked/bleeding/bruising/blister): Expressed breast milk to nipple  Hold (Positioning): Assistance needed to correctly position infant at breast and maintain latch. Intervention(s): Position options;Support Pillows  LATCH Score: 7  Lactation Tools Discussed/Used WIC Program: No   Consult Status Consult Status: Follow-up Date: 07/13/15 Follow-up type: In-patient    Rulon Eisenmenger 07/12/2015, 10:43 PM

## 2015-07-12 NOTE — Progress Notes (Signed)
SVD of vigerous female infant w/ apgars of 8,9.  Placenta delivered spontaneous w/ 3VC.   Partial 3rd degree lac repaired w/ 3-0 vicryl rapide in standard fashion.  Fundus firm.  EBL 400cc .

## 2015-07-12 NOTE — Progress Notes (Signed)
Pt comfortable  FHT cat 1 Toco Q2 Cvx c/c/+1  A/P:  Will start pushing

## 2015-07-13 LAB — CBC
HEMATOCRIT: 29 % — AB (ref 36.0–46.0)
HEMOGLOBIN: 9.7 g/dL — AB (ref 12.0–15.0)
MCH: 30.7 pg (ref 26.0–34.0)
MCHC: 33.4 g/dL (ref 30.0–36.0)
MCV: 91.8 fL (ref 78.0–100.0)
Platelets: 146 10*3/uL — ABNORMAL LOW (ref 150–400)
RBC: 3.16 MIL/uL — ABNORMAL LOW (ref 3.87–5.11)
RDW: 13.2 % (ref 11.5–15.5)
WBC: 15.8 10*3/uL — AB (ref 4.0–10.5)

## 2015-07-13 MED ORDER — PRENATAL MULTIVITAMIN CH
1.0000 | ORAL_TABLET | Freq: Every day | ORAL | Status: DC
Start: 2015-07-13 — End: 2015-07-14
  Administered 2015-07-13: 1 via ORAL
  Filled 2015-07-13: qty 1

## 2015-07-13 NOTE — Progress Notes (Signed)
MOB was referred for history of depression/anxiety.  Referral is screened out by Clinical Social Worker because none of the following criteria appear to apply: -History of anxiety/depression during this pregnancy, or of post-partum depression. - Diagnosis of anxiety and/or depression within last 3 years or -MOB's symptoms are currently being treated with medication and/or therapy. Currently prescribed Celexa, with no concerns noted during pregnancy.   Please contact the Clinical Social Worker if needs arise or upon MOB request.

## 2015-07-13 NOTE — Lactation Note (Signed)
This note was copied from a baby's chart. Lactation Consultation Note Follow up visit at 27 hours of age.  Mom called for latch assist, but baby is very sleepy, swaddled and held by mom.  Mom undressed baby , waking techniques demonstrated.  Mom is able to hand express drops of colostrum to mouth, but baby didn't wake up.  Mom denies other concerns at this time.  Baby remains STS with mom.   Patient Name: Patty Castillo [id number]'[id number] Date: 07/13/2015 Reason for consult: Follow-up assessment   Maternal Data Has patient been taught Hand Expression?: Yes  Feeding Feeding Type: Breast Fed  LATCH Score/Interventions Latch: Too sleepy or reluctant, no latch achieved, no sucking elicited.  Audible Swallowing: None  Type of Nipple: Everted at rest and after stimulation  Comfort (Breast/Nipple): Soft / non-tender     Hold (Positioning): No assistance needed to correctly position infant at breast. Intervention(s): Breastfeeding basics reviewed;Skin to skin  LATCH Score: 6  Lactation Tools Discussed/Used     Consult Status Consult Status: Follow-up Date: 07/14/15 Follow-up type: In-patient    Beverely Risen Arvella Merles 07/13/2015, 9:34 PM

## 2015-07-13 NOTE — Progress Notes (Signed)
Post Partum Day 1 Subjective: no complaints, up ad lib, voiding, tolerating PO and + flatus  Objective: Blood pressure 97/57, pulse 67, temperature 98.4 F (36.9 C), temperature source Oral, resp. rate 18, height  (1.702 m), weight 145 lb (65.772 kg), SpO2 100 %, unknown if currently breastfeeding.  Physical Exam:  General: alert and cooperative Lochia: appropriate Uterine Fundus: firm Incision: healing well, small non thrombosed hemorrhoids DVT Evaluation: No evidence of DVT seen on physical exam. Negative Homan's sign. No cords or calf tenderness. No significant calf/ankle edema.   Recent Labs  07/12/15 0530 07/13/15 0522  HGB 11.6* 9.7*  HCT 34.0* 29.0*    Assessment/Plan: Plan for discharge tomorrow   LOS: 1 day   Sharnise Blough G 07/13/2015, 7:59 AM

## 2015-07-13 NOTE — Anesthesia Postprocedure Evaluation (Signed)
Anesthesia Post Note  Patient: Patty Castillo  Procedure(s) Performed: * No procedures listed *  Patient location during evaluation: Mother Baby Anesthesia Type: Epidural Level of consciousness: patient remains intubated per anesthesia plan, awake and alert, oriented and patient cooperative Pain management: pain level controlled Vital Signs Assessment: post-procedure vital signs reviewed and stable Respiratory status: spontaneous breathing Cardiovascular status: stable Postop Assessment: no headache, adequate PO intake, patient able to bend at knees, no backache, epidural receding and no signs of nausea or vomiting Anesthetic complications: no    Last Vitals:  Filed Vitals:   07/13/15 0800 07/13/15 1142  BP: 96/59 100/62  Pulse: 68 86  Temp: 36.6 C 36.7 C  Resp: 18 18    Last Pain:  Filed Vitals:   07/13/15 1254  PainSc: 3                  Torrey Horseman, [employer]

## 2015-07-14 MED ORDER — HYDROCODONE-ACETAMINOPHEN 5-325 MG PO TABS: 1.0000 | ORAL_TABLET | ORAL | Status: DC | PRN

## 2015-07-14 MED ORDER — IBUPROFEN 600 MG PO TABS: 600.0000 mg | ORAL_TABLET | Freq: Four times a day (QID) | ORAL | Status: DC

## 2015-07-14 MED ORDER — CITALOPRAM HYDROBROMIDE 40 MG PO TABS: 40.0000 mg | ORAL_TABLET | Freq: Every day | ORAL | Status: DC

## 2015-07-14 NOTE — Discharge Summary (Signed)
Obstetric Discharge Summary Reason for Admission: onset of labor Prenatal Procedures: ultrasound Intrapartum Procedures: spontaneous vaginal delivery Postpartum Procedures: none Complications-Operative and Postpartum: 3 degree perineal laceration HEMOGLOBIN  Date Value Ref Range Status  07/13/2015 9.7* 12.0 - 15.0 g/dL Final   HCT  Date Value Ref Range Status  07/13/2015 29.0* 36.0 - 46.0 % Final    Physical Exam:  General: alert and cooperative Lochia: appropriate Uterine Fundus: firm Incision: healing well DVT Evaluation: No evidence of DVT seen on physical exam. Negative Homan's sign. No cords or calf tenderness. No significant calf/ankle edema.  Discharge Diagnoses: Term Pregnancy-delivered  Discharge Information: Date: 07/14/2015 Activity: pelvic rest Diet: routine Medications: PNV, Ibuprofen, Colace and Vicodin Condition: stable Instructions: refer to practice specific booklet Discharge to: home   Newborn Data: Live born female  Birth Weight: 7 lb 5.8 oz (3340 g) APGAR: 8, 9  Home with mother.  Sadiyah Kangas G 07/14/2015, 8:19 AM

## 2015-07-14 NOTE — Lactation Note (Signed)
This note was copied from a baby's chart. Lactation Consultation Note: Mother is skin to skin with infant when I arrive in the room for follow up. Mother states that breastfeeding is going well. Staff nurse reports that she observed infant feeding and will record latch. Dicussed cluster feeding and cue base feeding. Mother advised to feed infant 8-12 times in 24 hours and with feeding cues. Mother is unsure if she is hearing swallows. Discussed sound of swallow and infant behavior when swallowing. Mother advised to page for next feeding as needed. Mother confirms that she has large amts of colostrum. Mother informed of treatment plan to prevent severe engorgement. Encouraged mother to continue to practice frequent STS when home. Mother is aware of available LC services and community support.   Patient Name: Patty Castillo [id number] Date: 07/14/2015 Reason for consult: Follow-up assessment   Maternal Data    Feeding Feeding Type: Breast Fed  LATCH Score/Interventions Latch: Grasps breast easily, tongue down, lips flanged, rhythmical sucking.  Audible Swallowing: Spontaneous and intermittent  Type of Nipple: Everted at rest and after stimulation  Comfort (Breast/Nipple): Soft / non-tender     Hold (Positioning): No assistance needed to correctly position infant at breast.  LATCH Score: 10  Lactation Tools Discussed/Used     Consult Status Consult Status: Complete    Michel Bickers 07/14/2015, 10:10 AM

## 2015-07-17 ENCOUNTER — Inpatient Hospital Stay (HOSPITAL_COMMUNITY): Admission: RE | Admit: 2015-07-17 | Payer: BLUE CROSS/BLUE SHIELD | Source: Ambulatory Visit

## 2016-02-17 ENCOUNTER — Emergency Department (HOSPITAL_COMMUNITY)
Admission: EM | Admit: 2016-02-17 | Discharge: 2016-02-17 | Disposition: A | Discharge status: Home / Self Care | Payer: BLUE CROSS/BLUE SHIELD | Attending: Emergency Medicine | Admitting: Emergency Medicine

## 2016-02-17 ENCOUNTER — Encounter (HOSPITAL_COMMUNITY): Payer: Self-pay | Admitting: Emergency Medicine

## 2016-02-17 DIAGNOSIS — Z5321 Procedure and treatment not carried out due to patient leaving prior to being seen by health care provider: Secondary | ICD-10-CM | POA: Insufficient documentation

## 2016-02-17 DIAGNOSIS — R1010 Upper abdominal pain, unspecified: Secondary | ICD-10-CM | POA: Insufficient documentation

## 2016-02-17 LAB — LIPASE, BLOOD: LIPASE: 33 U/L (ref 11–51)

## 2016-02-17 LAB — COMPREHENSIVE METABOLIC PANEL
ALBUMIN: 4.2 g/dL (ref 3.5–5.0)
ALT: 9 U/L — ABNORMAL LOW (ref 14–54)
ANION GAP: 7 (ref 5–15)
AST: 16 U/L (ref 15–41)
Alkaline Phosphatase: 80 U/L (ref 38–126)
BUN: 13 mg/dL (ref 6–20)
CHLORIDE: 105 mmol/L (ref 101–111)
CO2: 28 mmol/L (ref 22–32)
Calcium: 9.2 mg/dL (ref 8.9–10.3)
Creatinine, Ser: 0.73 mg/dL (ref 0.44–1.00)
GFR calc Af Amer: >60 mL/min (ref >60)
GLUCOSE: 105 mg/dL — AB (ref 65–99)
POTASSIUM: 3.7 mmol/L (ref 3.5–5.1)
Sodium: 140 mmol/L (ref 135–145)
Total Bilirubin: 0.3 mg/dL (ref 0.3–1.2)
Total Protein: 6.5 g/dL (ref 6.5–8.1)

## 2016-02-17 LAB — CBC
HEMATOCRIT: 38.3 % (ref 36.0–46.0)
HEMOGLOBIN: 12.5 g/dL (ref 12.0–15.0)
MCH: 30.3 pg (ref 26.0–34.0)
MCHC: 32.6 g/dL (ref 30.0–36.0)
MCV: 92.7 fL (ref 78.0–100.0)
Platelets: 207 10*3/uL (ref 150–400)
RBC: 4.13 MIL/uL (ref 3.87–5.11)
RDW: 11.9 % (ref 11.5–15.5)
WBC: 5.9 10*3/uL (ref 4.0–10.5)

## 2016-02-17 LAB — I-STAT BETA HCG BLOOD, ED (MC, WL, AP ONLY)

## 2016-02-17 NOTE — ED Notes (Signed)
Pt LWBs. Pt stated that symptoms have subsided and she is going to go home and sleep.  Pt advised to return if symptoms return.

## 2016-02-17 NOTE — ED Triage Notes (Signed)
Pt sts upper abd pain x 1 month worse today had U/S with GI; pt sts worse after eating sometimes

## 2016-03-11 ENCOUNTER — Other Ambulatory Visit (HOSPITAL_COMMUNITY): Payer: Self-pay | Admitting: Gastroenterology

## 2016-03-11 DIAGNOSIS — R1013 Epigastric pain: Secondary | ICD-10-CM

## 2016-03-15 ENCOUNTER — Ambulatory Visit (HOSPITAL_COMMUNITY)
Admission: RE | Admit: 2016-03-15 | Discharge: 2016-03-15 | Disposition: A | Discharge status: Home / Self Care | Payer: BLUE CROSS/BLUE SHIELD | Source: Ambulatory Visit | Attending: Gastroenterology | Admitting: Gastroenterology

## 2016-03-15 DIAGNOSIS — R1013 Epigastric pain: Secondary | ICD-10-CM | POA: Insufficient documentation

## 2016-03-15 MED ORDER — SINCALIDE 5 MCG IJ SOLR
0.0200 ug/kg | Freq: Once | INTRAMUSCULAR | Status: AC
  Administered 2016-03-15: 12:00:00 via INTRAVENOUS

## 2016-03-15 MED ORDER — TECHNETIUM TC 99M MEBROFENIN IV KIT
5.4000 | PACK | Freq: Once | INTRAVENOUS | Status: AC | PRN
  Administered 2016-03-15: 5.4 via INTRAVENOUS

## 2017-04-10 LAB — OB RESULTS CONSOLE RPR: RPR: NONREACTIVE

## 2017-04-10 LAB — OB RESULTS CONSOLE ABO/RH: RH Type: POSITIVE

## 2017-04-10 LAB — OB RESULTS CONSOLE RUBELLA ANTIBODY, IGM: Rubella: IMMUNE

## 2017-04-10 LAB — OB RESULTS CONSOLE GC/CHLAMYDIA
Chlamydia: NEGATIVE
GC PROBE AMP, GENITAL: NEGATIVE

## 2017-04-10 LAB — OB RESULTS CONSOLE HEPATITIS B SURFACE ANTIGEN: Hepatitis B Surface Ag: NEGATIVE

## 2017-04-10 LAB — OB RESULTS CONSOLE ANTIBODY SCREEN: Antibody Screen: NEGATIVE

## 2017-04-10 LAB — OB RESULTS CONSOLE HIV ANTIBODY (ROUTINE TESTING): HIV: NONREACTIVE

## 2017-09-01 IMAGING — US US MFM OB LIMITED
1 series · 7 of 7 positions shown · non-contrast
Comparison: none

MORZELLA

MAU/Triage
Indications
Determine fetal presentation using             Z36
ultrasound
Postdate pregnancy (40-42 weeks)
OB History
Gravidity:    1         Term:   0        Prem:   0        SAB:   0
TOP:          0       Ectopic:  0        Living: 0
Fetal Evaluation
Num Of Fetuses:     1
Fetal Heart         138
Rate(bpm):
Cardiac Activity:   Observed
Presentation:       Cephalic
Placenta:           Anterior, above cervical os
Amniotic Fluid
AFI FV:      Subjectively within normal limits
Larg Pckt:     4.5  cm
Gestational Age
LMP:           40w 2d        Date:  10/03/14                 EDD:   07/10/15
Best:          40w 2d     Det. By:  LMP  (10/03/14)          EDD:   07/10/15
Impression
INDICATION: 29 yr old G1P0 at 84w8d for fetal ultrasound to
determine fetal presentation. Remote read.

[Series 1: us mfm ob limited · 7 of 7 slices shown]
[im 1/7]
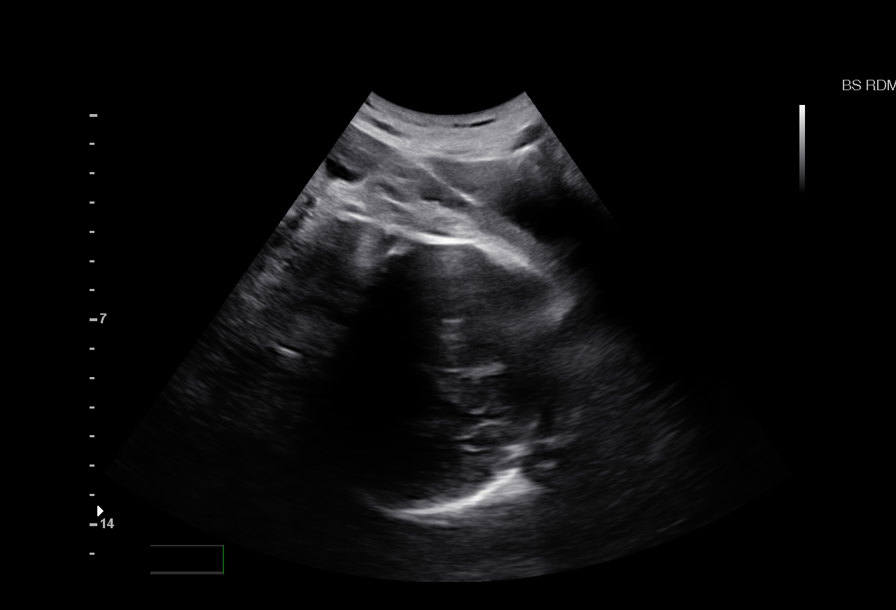
[im 2/7]
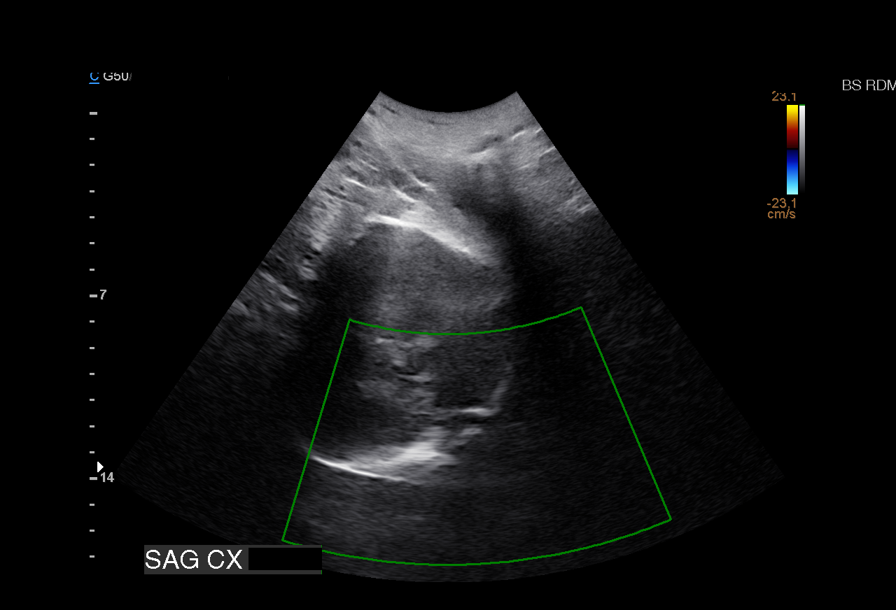
[im 3/7]
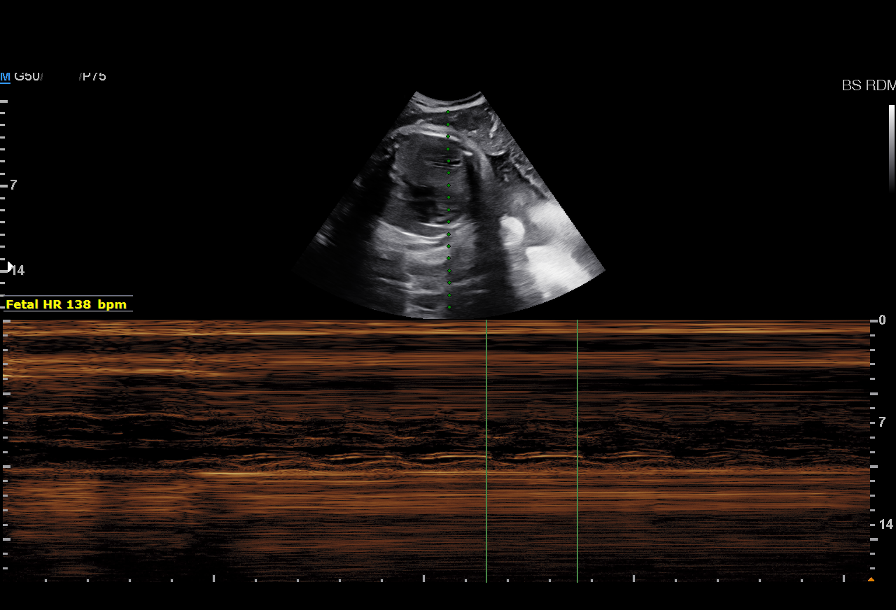
[im 4/7]
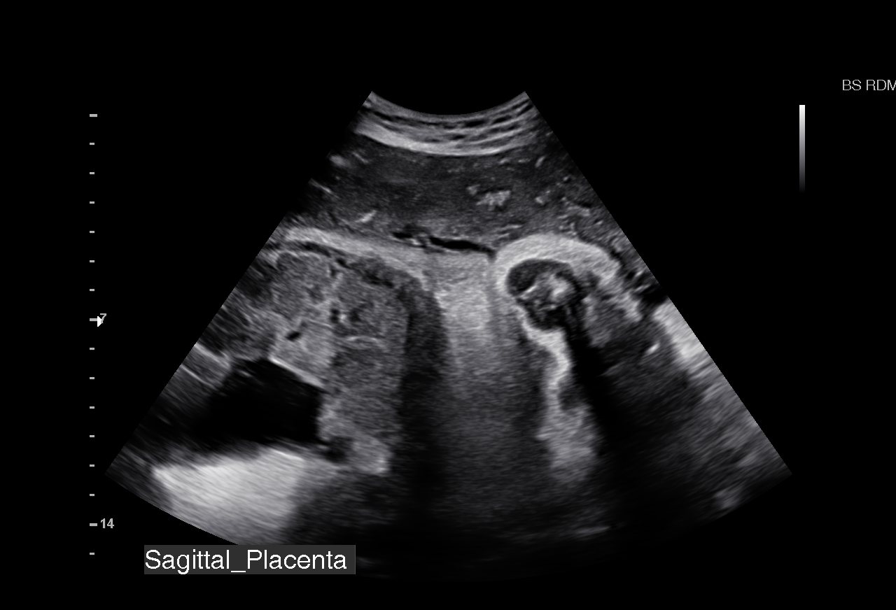
[im 5/7]
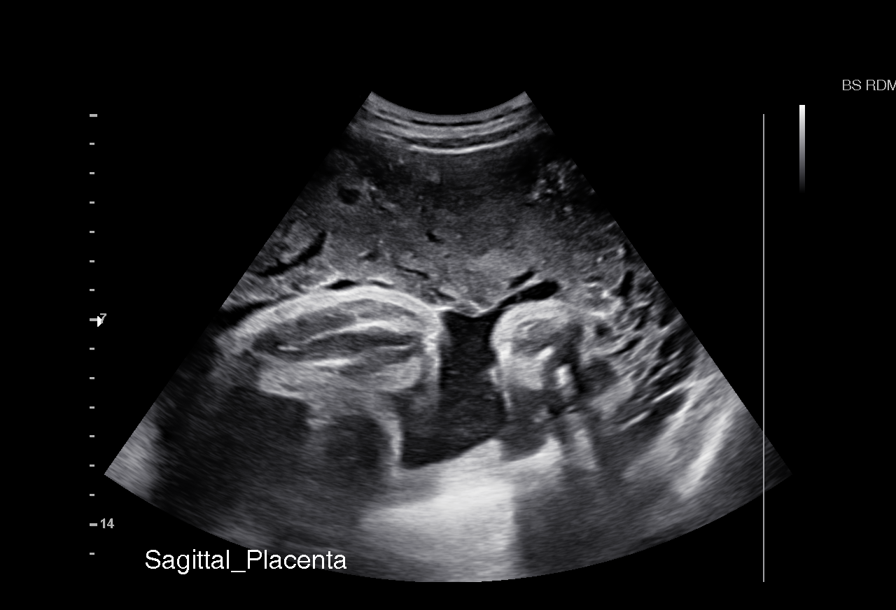
[im 6/7]
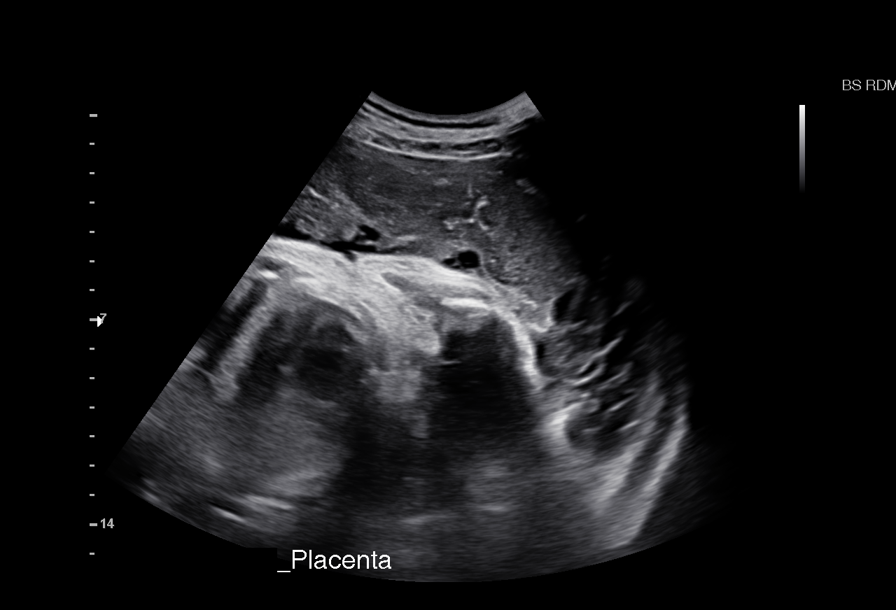
[im 7/7]
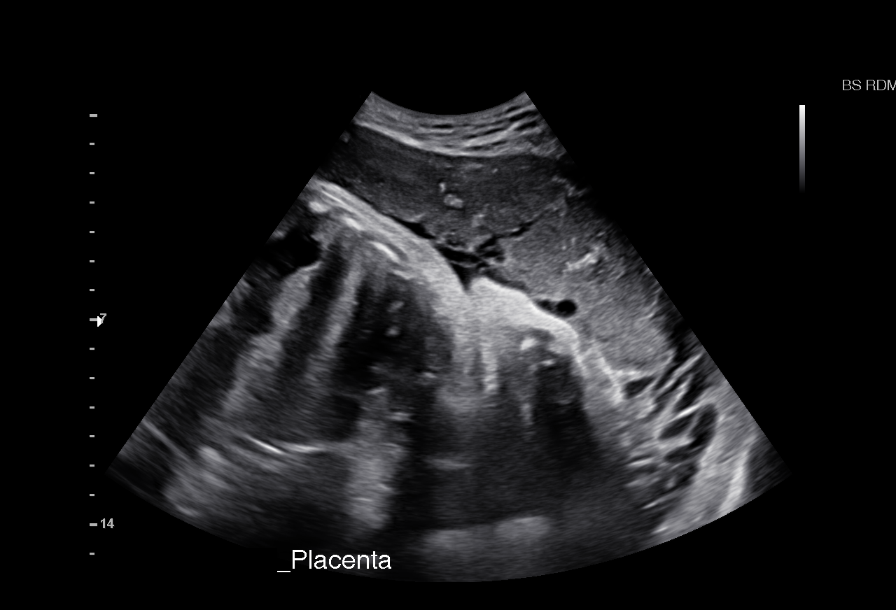

[7 of 7 positions shown; findings below may reference images not displayed]

FINDINGS: 1. Single intrauterine pregnancy.
2. Anterior placenta without evidence of previa.
3. Normal amniotic fluid volume.
4. Fetus is in cephalic presentation.
Recommendations

1. Fetus is in cephalic presentation.
2. Patient being admitted for labor.

## 2017-09-30 ENCOUNTER — Inpatient Hospital Stay (HOSPITAL_COMMUNITY)
Admission: AD | Admit: 2017-09-30 | Discharge: 2017-09-30 | Disposition: A | Discharge status: Home / Self Care | Payer: Commercial Managed Care - PPO | Source: Ambulatory Visit | Attending: Obstetrics and Gynecology | Admitting: Obstetrics and Gynecology

## 2017-09-30 ENCOUNTER — Other Ambulatory Visit: Payer: Self-pay

## 2017-09-30 ENCOUNTER — Encounter (HOSPITAL_COMMUNITY): Payer: Self-pay | Admitting: *Deleted

## 2017-09-30 DIAGNOSIS — F329 Major depressive disorder, single episode, unspecified: Secondary | ICD-10-CM | POA: Diagnosis not present

## 2017-09-30 DIAGNOSIS — G4700 Insomnia, unspecified: Secondary | ICD-10-CM | POA: Diagnosis not present

## 2017-09-30 DIAGNOSIS — O99340 Other mental disorders complicating pregnancy, unspecified trimester: Secondary | ICD-10-CM

## 2017-09-30 DIAGNOSIS — F5101 Primary insomnia: Secondary | ICD-10-CM

## 2017-09-30 DIAGNOSIS — O99343 Other mental disorders complicating pregnancy, third trimester: Secondary | ICD-10-CM | POA: Diagnosis not present

## 2017-09-30 DIAGNOSIS — Z3A37 37 weeks gestation of pregnancy: Secondary | ICD-10-CM | POA: Diagnosis not present

## 2017-09-30 DIAGNOSIS — F419 Anxiety disorder, unspecified: Secondary | ICD-10-CM | POA: Diagnosis not present

## 2017-09-30 DIAGNOSIS — O26893 Other specified pregnancy related conditions, third trimester: Secondary | ICD-10-CM | POA: Diagnosis not present

## 2017-09-30 LAB — URINALYSIS, ROUTINE W REFLEX MICROSCOPIC
BILIRUBIN URINE: NEGATIVE
GLUCOSE, UA: NEGATIVE mg/dL
HGB URINE DIPSTICK: NEGATIVE
KETONES UR: NEGATIVE mg/dL
NITRITE: NEGATIVE
PH: 7 (ref 5.0–8.0)
Protein, ur: NEGATIVE mg/dL
Specific Gravity, Urine: 1.015 (ref 1.005–1.030)

## 2017-09-30 MED ORDER — ZOLPIDEM TARTRATE 5 MG PO TABS: 5.0000 mg | ORAL_TABLET | Freq: Every evening | ORAL | 0 refills | Status: DC | PRN

## 2017-09-30 NOTE — MAU Note (Signed)
Past few days, has been suffering from her anxiety and depression really bad.  Say dr Renaldo Fiddler yesterday, upped dosing of Celexa.   Until that kicks in, she isn't sleeping, no appetite, been having diarrhea, nausea, not really able to do anything, constant urge to pee- but not really going. Denies any suicidal thoughts or ideation.Marland Kitchen

## 2017-09-30 NOTE — MAU Note (Signed)
States things are fine at home, "has a great support system, this is all just me"

## 2017-09-30 NOTE — MAU Provider Note (Signed)
History   Family is a 37.6 wk station a.m. with complaints of anxiety depression and insomnia.  She is currently on Celexa which they have just increased her dosage to 20 mg a day as of yesterday.  But in today with a feeling of hopelessness and desires some medication to help with the insomnia and anxiety.  She denies thoughts of harming herself or others.   CSN: [id number]  Arrival date & time 09/30/17  1109   None     Chief Complaint  Patient presents with  . Nausea  . Diarrhea  . Insomnia  . Anorexia    HPI  Past Medical History:  Diagnosis Date  . Anxiety   . Depression   . Hx of varicella     Past Surgical History:  Procedure Laterality Date  . NO PAST SURGERIES      Family History  Problem Relation Age of Onset  . Cancer Mother        breast  . Hypothyroidism Mother   . Hypertension Father   . Depression Sister   . Alzheimer's disease Maternal Grandmother   . Diabetes Maternal Grandfather   . Mental illness Paternal Grandmother   . Depression Paternal Grandmother   . Cancer Paternal Grandmother        breast  . Cancer Paternal Grandfather   . Hypothyroidism Maternal Aunt     Social History   Tobacco Use  . Smoking status: Never Smoker  . Smokeless tobacco: Never Used  Substance Use Topics  . Alcohol use: No  . Drug use: No    OB History    Gravida  2   Para  1   Term  1   Preterm      AB      Living  1     SAB      TAB      Ectopic      Multiple  0   Live Births  1           Review of Systems  Constitutional: Negative.   HENT: Negative.   Eyes: Negative.   Respiratory: Negative.   Cardiovascular: Negative.   Gastrointestinal: Negative.   Endocrine: Negative.   Genitourinary: Negative.   Skin: Negative.   Allergic/Immunologic: Negative.   Neurological: Negative.   Hematological: Negative.   Psychiatric/Behavioral: Positive for sleep disturbance. The patient is nervous/anxious.     Allergies   Doxycycline  Home Medications    BP 106/67 (BP Location: Right Arm)   Pulse 96   Temp 98.6 F (37 C) (Oral)   Resp 16   Wt 139 lb 4 oz (63.2 kg)   SpO2 99%   BMI 21.81 kg/m   Physical Exam  Constitutional: She is oriented to person, place, and time. She appears well-developed and well-nourished.  HENT:  Head: Normocephalic.  Neck: Normal range of motion.  Cardiovascular: Normal rate, regular rhythm, normal heart sounds and intact distal pulses.  Pulmonary/Chest: Effort normal and breath sounds normal.  Abdominal: Soft. Bowel sounds are normal.  Musculoskeletal: Normal range of motion.  Neurological: She is alert and oriented to person, place, and time.  Skin: Skin is warm and dry.  Psychiatric: She has a normal mood and affect. Her behavior is normal. Judgment and thought content normal.    MAU Course  Procedures (including critical care time)  Labs Reviewed  URINALYSIS, ROUTINE W REFLEX MICROSCOPIC - Abnormal; Notable for the following components:      Result Value  APPearance CLOUDY (*)    Leukocytes, UA TRACE (*)    Bacteria, UA RARE (*)    All other components within normal limits   No results found.   1. Anxiety   2. Depression affecting pregnancy   3. Primary insomnia       MDM  VSS, FHR pattern reassuring. No uc's. POC discussed with Dr. Vincente Poli. Will d/c pt home with Rx for ambian 5 mg # 10.

## 2017-11-02 ENCOUNTER — Telehealth (HOSPITAL_COMMUNITY): Payer: Self-pay | Admitting: *Deleted

## 2017-11-02 ENCOUNTER — Inpatient Hospital Stay (HOSPITAL_COMMUNITY)
Admission: AD | Admit: 2017-11-02 | Discharge: 2017-11-03 | Disposition: A | Discharge status: Home / Self Care | Payer: Commercial Managed Care - PPO | Source: Ambulatory Visit | Attending: Obstetrics and Gynecology | Admitting: Obstetrics and Gynecology

## 2017-11-02 ENCOUNTER — Encounter (HOSPITAL_COMMUNITY): Payer: Self-pay

## 2017-11-02 DIAGNOSIS — O479 False labor, unspecified: Secondary | ICD-10-CM

## 2017-11-02 NOTE — Telephone Encounter (Signed)
Preadmission screen  

## 2017-11-02 NOTE — MAU Note (Signed)
Started having CTX that were 4-5 mins apart for 4 hours then went away.  Now they came back around 8-9 minutes apart.  No LOF/VB.  Last VE yesterday 2cm/75%.  +FM

## 2017-11-03 NOTE — MAU Note (Signed)
I have communicated with Dr. Renaldo FiddlerAdkins and reviewed vital signs:  Vitals:   11/02/17 2205 11/03/17 0015  BP: 112/68 108/67  Pulse: 89   Resp: 19   Temp: 97.9 F (36.6 C)   SpO2: 100%     Vaginal exam:  Dilation: 2 Effacement (%): 70 Cervical Position: Posterior Presentation: Vertex Exam by:: Bari Mantis. Adeja Sarratt RN ,   Also reviewed contraction pattern and that non-stress test is reactive.  It has been documented that patient is contracting every 3-5  minutes with no cervical change over one  hours not indicating active labor.  Patient denies any other complaints.  Based on this report provider has given order for discharge.  A discharge order and diagnosis entered by a provider.   Labor discharge instructions reviewed with patient.

## 2017-11-03 NOTE — Discharge Instructions (Signed)
Braxton Hicks Contractions °Contractions of the uterus can occur throughout pregnancy, but they are not always a sign that you are in labor. You may have practice contractions called Braxton Hicks contractions. These false labor contractions are sometimes confused with true labor. °What are Braxton Hicks contractions? °Braxton Hicks contractions are tightening movements that occur in the muscles of the uterus before labor. Unlike true labor contractions, these contractions do not result in opening (dilation) and thinning of the cervix. Toward the end of pregnancy (32-34 weeks), Braxton Hicks contractions can happen more often and may become stronger. These contractions are sometimes difficult to tell apart from true labor because they can be very uncomfortable. You should not feel embarrassed if you go to the hospital with false labor. °Sometimes, the only way to tell if you are in true labor is for your health care provider to look for changes in the cervix. The health care provider will do a physical exam and may monitor your contractions. If you are not in true labor, the exam should show that your cervix is not dilating and your water has not broken. °If there are other health problems associated with your pregnancy, it is completely safe for you to be sent home with false labor. You may continue to have Braxton Hicks contractions until you go into true labor. °How to tell the difference between true labor and false labor °True labor °· Contractions last 30-70 seconds. °· Contractions become very regular. °· Discomfort is usually felt in the top of the uterus, and it spreads to the lower abdomen and low back. °· Contractions do not go away with walking. °· Contractions usually become more intense and increase in frequency. °· The cervix dilates and gets thinner. °False labor °· Contractions are usually shorter and not as strong as true labor contractions. °· Contractions are usually irregular. °· Contractions  are often felt in the front of the lower abdomen and in the groin. °· Contractions may go away when you walk around or change positions while lying down. °· Contractions get weaker and are shorter-lasting as time goes on. °· The cervix usually does not dilate or become thin. °Follow these instructions at home: °· Take over-the-counter and prescription medicines only as told by your health care provider. °· Keep up with your usual exercises and follow other instructions from your health care provider. °· Eat and drink lightly if you think you are going into labor. °· If Braxton Hicks contractions are making you uncomfortable: °? Change your position from lying down or resting to walking, or change from walking to resting. °? Sit and rest in a tub of warm water. °? Drink enough fluid to keep your urine pale yellow. Dehydration may cause these contractions. °? Do slow and deep breathing several times an hour. °· Keep all follow-up prenatal visits as told by your health care provider. This is important. °Contact a health care provider if: °· You have a fever. °· You have continuous pain in your abdomen. °Get help right away if: °· Your contractions become stronger, more regular, and closer together. °· You have fluid leaking or gushing from your vagina. °· You pass blood-tinged mucus (bloody show). °· You have bleeding from your vagina. °· You have low back pain that you never had before. °· You feel your baby’s head pushing down and causing pelvic pressure. °· Your baby is not moving inside you as much as it used to. °Summary °· Contractions that occur before labor are called Braxton   Hicks contractions, false labor, or practice contractions. °· Braxton Hicks contractions are usually shorter, weaker, farther apart, and less regular than true labor contractions. True labor contractions usually become progressively stronger and regular and they become more frequent. °· Manage discomfort from Braxton Hicks contractions by  changing position, resting in a warm bath, drinking plenty of water, or practicing deep breathing. °This information is not intended to replace advice given to you by your health care provider. Make sure you discuss any questions you have with your health care provider. °Document Released: 09/29/2016 Document Revised: 09/29/2016 Document Reviewed: 09/29/2016 °Elsevier Interactive Patient Education © 2018 [employer] ° °

## 2017-11-04 ENCOUNTER — Encounter (HOSPITAL_COMMUNITY): Payer: Self-pay | Admitting: *Deleted

## 2017-11-04 ENCOUNTER — Inpatient Hospital Stay (HOSPITAL_COMMUNITY): Payer: Commercial Managed Care - PPO | Admitting: Anesthesiology

## 2017-11-04 ENCOUNTER — Inpatient Hospital Stay (HOSPITAL_COMMUNITY)
Admission: AD | Admit: 2017-11-04 | Discharge: 2017-11-07 | DRG: 768 | Disposition: A | Discharge status: Home / Self Care | Payer: Commercial Managed Care - PPO | Attending: Obstetrics and Gynecology | Admitting: Obstetrics and Gynecology

## 2017-11-04 ENCOUNTER — Inpatient Hospital Stay (HOSPITAL_COMMUNITY)
Admission: AD | Admit: 2017-11-04 | Discharge: 2017-11-04 | Disposition: A | Discharge status: Home / Self Care | Payer: Commercial Managed Care - PPO | Source: Ambulatory Visit | Attending: Obstetrics and Gynecology | Admitting: Obstetrics and Gynecology

## 2017-11-04 DIAGNOSIS — O479 False labor, unspecified: Secondary | ICD-10-CM

## 2017-11-04 DIAGNOSIS — Z3483 Encounter for supervision of other normal pregnancy, third trimester: Secondary | ICD-10-CM | POA: Diagnosis present

## 2017-11-04 DIAGNOSIS — Z3A40 40 weeks gestation of pregnancy: Secondary | ICD-10-CM | POA: Diagnosis not present

## 2017-11-04 LAB — CBC
HCT: 36 % (ref 36.0–46.0)
Hemoglobin: 12.3 g/dL (ref 12.0–15.0)
MCH: 31.7 pg (ref 26.0–34.0)
MCHC: 34.2 g/dL (ref 30.0–36.0)
MCV: 92.8 fL (ref 78.0–100.0)
PLATELETS: 183 10*3/uL (ref 150–400)
RBC: 3.88 MIL/uL (ref 3.87–5.11)
RDW: 13 % (ref 11.5–15.5)
WBC: 16.2 10*3/uL — ABNORMAL HIGH (ref 4.0–10.5)

## 2017-11-04 LAB — TYPE AND SCREEN
ABO/RH(D): O POS
Antibody Screen: NEGATIVE

## 2017-11-04 MED ORDER — OXYCODONE-ACETAMINOPHEN 5-325 MG PO TABS: 1.0000 | ORAL_TABLET | ORAL | Status: DC | PRN

## 2017-11-04 MED ORDER — EPHEDRINE 5 MG/ML INJ
10.0000 mg | INTRAVENOUS | Status: DC | PRN
  Filled 2017-11-04: qty 2

## 2017-11-04 MED ORDER — OXYTOCIN 40 UNITS IN LACTATED RINGERS INFUSION - SIMPLE MED
INTRAVENOUS | Status: AC
  Filled 2017-11-04: qty 1000

## 2017-11-04 MED ORDER — SOD CITRATE-CITRIC ACID 500-334 MG/5ML PO SOLN: 30.0000 mL | ORAL | Status: DC | PRN

## 2017-11-04 MED ORDER — FENTANYL 2.5 MCG/ML BUPIVACAINE 1/10 % EPIDURAL INFUSION (WH - ANES)
INTRAMUSCULAR | Status: AC
  Filled 2017-11-04: qty 100

## 2017-11-04 MED ORDER — LIDOCAINE HCL (PF) 1 % IJ SOLN
30.0000 mL | INTRAMUSCULAR | Status: AC | PRN
  Administered 2017-11-05: 30 mL via SUBCUTANEOUS
  Filled 2017-11-04: qty 30

## 2017-11-04 MED ORDER — FLEET ENEMA 7-19 GM/118ML RE ENEM: 1.0000 | ENEMA | RECTAL | Status: DC | PRN

## 2017-11-04 MED ORDER — LIDOCAINE HCL (PF) 1 % IJ SOLN
INTRAMUSCULAR | Status: AC
  Filled 2017-11-04: qty 30

## 2017-11-04 MED ORDER — PHENYLEPHRINE 40 MCG/ML (10ML) SYRINGE FOR IV PUSH (FOR BLOOD PRESSURE SUPPORT)
80.0000 ug | PREFILLED_SYRINGE | INTRAVENOUS | Status: DC | PRN
  Filled 2017-11-04: qty 5

## 2017-11-04 MED ORDER — FENTANYL 2.5 MCG/ML BUPIVACAINE 1/10 % EPIDURAL INFUSION (WH - ANES)
14.0000 mL/h | INTRAMUSCULAR | Status: DC | PRN
  Administered 2017-11-04 – 2017-11-05 (×2): 14 mL/h via EPIDURAL
  Filled 2017-11-04: qty 100

## 2017-11-04 MED ORDER — OXYTOCIN 40 UNITS IN LACTATED RINGERS INFUSION - SIMPLE MED: 2.5000 [IU]/h | INTRAVENOUS | Status: DC

## 2017-11-04 MED ORDER — OXYTOCIN BOLUS FROM INFUSION
500.0000 mL | Freq: Once | INTRAVENOUS | Status: AC
  Administered 2017-11-05: 500 mL via INTRAVENOUS

## 2017-11-04 MED ORDER — BUTORPHANOL TARTRATE 1 MG/ML IJ SOLN: 1.0000 mg | INTRAMUSCULAR | Status: DC | PRN

## 2017-11-04 MED ORDER — LACTATED RINGERS IV SOLN
INTRAVENOUS | Status: DC
  Administered 2017-11-04: 22:00:00 via INTRAVENOUS

## 2017-11-04 MED ORDER — ONDANSETRON HCL 4 MG/2ML IJ SOLN
4.0000 mg | Freq: Four times a day (QID) | INTRAMUSCULAR | Status: DC | PRN
  Administered 2017-11-05: 4 mg via INTRAVENOUS
  Filled 2017-11-04: qty 2

## 2017-11-04 MED ORDER — PHENYLEPHRINE 40 MCG/ML (10ML) SYRINGE FOR IV PUSH (FOR BLOOD PRESSURE SUPPORT)
PREFILLED_SYRINGE | INTRAVENOUS | Status: AC
  Filled 2017-11-04: qty 20

## 2017-11-04 MED ORDER — BUTORPHANOL TARTRATE 1 MG/ML IJ SOLN
1.0000 mg | Freq: Once | INTRAMUSCULAR | Status: AC
  Administered 2017-11-04: 1 mg via INTRAMUSCULAR
  Filled 2017-11-04: qty 1

## 2017-11-04 MED ORDER — OXYCODONE-ACETAMINOPHEN 5-325 MG PO TABS: 2.0000 | ORAL_TABLET | ORAL | Status: DC | PRN

## 2017-11-04 MED ORDER — DIPHENHYDRAMINE HCL 50 MG/ML IJ SOLN: 12.5000 mg | INTRAMUSCULAR | Status: DC | PRN

## 2017-11-04 MED ORDER — EPHEDRINE 5 MG/ML INJ
INTRAVENOUS | Status: AC
  Filled 2017-11-04: qty 4

## 2017-11-04 MED ORDER — LIDOCAINE HCL (PF) 1 % IJ SOLN
INTRAMUSCULAR | Status: DC | PRN
  Administered 2017-11-04: 5 mL via EPIDURAL
  Administered 2017-11-04: 8 mL via EPIDURAL

## 2017-11-04 MED ORDER — LACTATED RINGERS IV SOLN
500.0000 mL | INTRAVENOUS | Status: DC | PRN
  Administered 2017-11-04: 1000 mL via INTRAVENOUS

## 2017-11-04 MED ORDER — LACTATED RINGERS IV SOLN: 500.0000 mL | Freq: Once | INTRAVENOUS | Status: DC

## 2017-11-04 MED ORDER — ACETAMINOPHEN 325 MG PO TABS: 650.0000 mg | ORAL_TABLET | ORAL | Status: DC | PRN

## 2017-11-04 MED ORDER — SODIUM CHLORIDE 0.9 % IV SOLN
2.0000 g | Freq: Once | INTRAVENOUS | Status: AC
  Administered 2017-11-04: 2 g via INTRAVENOUS
  Filled 2017-11-04: qty 2

## 2017-11-04 NOTE — Anesthesia Preprocedure Evaluation (Signed)
Anesthesia Evaluation  Patient identified by MRN, date of birth, ID band Patient awake    Reviewed: Allergy & Precautions, H&P , NPO status , Patient's Chart, lab work & pertinent test results  Airway Mallampati: I  TM Distance: >3 FB Neck ROM: full    Dental no notable dental hx. (+) Teeth Intact   Pulmonary neg pulmonary ROS,    Pulmonary exam normal breath sounds clear to auscultation       Cardiovascular negative cardio ROS Normal cardiovascular exam Rhythm:regular Rate:Normal     Neuro/Psych negative neurological ROS     GI/Hepatic negative GI ROS, Neg liver ROS,   Endo/Other  negative endocrine ROS  Renal/GU negative Renal ROS  negative genitourinary   Musculoskeletal negative musculoskeletal ROS (+)   Abdominal Normal abdominal exam  (+)   Peds  Hematology negative hematology ROS (+)   Anesthesia Other Findings   Reproductive/Obstetrics (+) Pregnancy                             Anesthesia Physical  Anesthesia Plan  ASA: II  Anesthesia Plan: Epidural   Post-op Pain Management:    Induction:   PONV Risk Score and Plan:   Airway Management Planned:   Additional Equipment:   Intra-op Plan:   Post-operative Plan:   Informed Consent: I have reviewed the patients History and Physical, chart, labs and discussed the procedure including the risks, benefits and alternatives for the proposed anesthesia with the patient or authorized representative who has indicated his/her understanding and acceptance.     Plan Discussed with:   Anesthesia Plan Comments:         Anesthesia Quick Evaluation  

## 2017-11-04 NOTE — MAU Note (Signed)
Ctx on an d off all day . much stronger now 2-5 min denies any vag discharge some bloody show earlier in the  day

## 2017-11-04 NOTE — MAU Note (Signed)
Pt. Having contractions all night long. Denies leaking or bleeding. +FM.

## 2017-11-04 NOTE — Progress Notes (Signed)
FHT cat one UCs q3-4 min Cx 6/C/0/vtx AROM clear

## 2017-11-04 NOTE — H&P (Signed)
Patty Castillo is a 32 y.o. female presenting for contractions. PNC uncomplicated. OB History    Gravida  2   Para  1   Term  1   Preterm      AB      Living  1     SAB      TAB      Ectopic      Multiple  0   Live Births  1          Past Medical History:  Diagnosis Date  . Anxiety   . Depression   . Hx of varicella    Past Surgical History:  Procedure Laterality Date  . NO PAST SURGERIES     Family History: family history includes Alzheimer's disease in her maternal grandmother; Cancer in her mother, paternal grandfather, and paternal grandmother; Depression in her paternal grandmother and sister; Diabetes in her maternal grandfather; Hypertension in her father; Hypothyroidism in her maternal aunt and mother; Mental illness in her paternal grandmother. Social History:  reports that she has never smoked. She has never used smokeless tobacco. She reports that she does not drink alcohol or use drugs.     Maternal Diabetes: No Genetic Screening: Normal Maternal Ultrasounds/Referrals: Normal Fetal Ultrasounds or other Referrals:  None Maternal Substance Abuse:  No Significant Maternal Medications:  None Significant Maternal Lab Results:  None Other Comments:  None  Review of Systems  Eyes: Negative for blurred vision.  Gastrointestinal: Negative for abdominal pain.  Neurological: Negative for headaches.   Maternal Medical History:  Reason for admission: Contractions.   Contractions: Onset was 6-12 hours ago.    Fetal activity: Perceived fetal activity is normal.      Dilation: 5.5 Effacement (%): 100 Station: 0 Exam by:: K.WIlosn,RN Blood pressure 119/69, pulse (!) 102, resp. rate 18, currently breastfeeding. Maternal Exam:  Uterine Assessment: Contraction strength is firm.  Contraction frequency is regular.   Abdomen: Fetal presentation: vertex     Fetal Exam Fetal State Assessment: Category I - tracings are  normal.     Physical Exam  Cardiovascular: Normal rate.  Respiratory: Effort normal.  GI: Soft.   Cx 5/C/+1/vtx  Prenatal labs: ABO, Rh: --/--/O POS (06/08 2044) Antibody: NEG (06/08 2044) Rubella: Immune (11/12 0000) RPR: Nonreactive (11/12 0000)  HBsAg: Negative (11/12 0000)  HIV: Non-reactive (11/12 0000)  GBS:     Assessment/Plan: 32 yo G2P1 in active labor Anticipate vaginal delivery Atb per protocol  Roselle LocusJames E Marsh Heckler II 11/04/2017, 9:39 PM

## 2017-11-04 NOTE — Anesthesia Procedure Notes (Signed)
Epidural Patient location during procedure: OB Start time: 11/04/2017 10:00 PM End time: 11/04/2017 10:03 PM  Staffing Anesthesiologist: Leilani AbleHatchett, Lebron Nauert, MD Performed: anesthesiologist   Preanesthetic Checklist Completed: patient identified, site marked, surgical consent, pre-op evaluation, timeout performed, IV checked, risks and benefits discussed and monitors and equipment checked  Epidural Patient position: sitting Prep: site prepped and draped and DuraPrep Patient monitoring: continuous pulse ox and blood pressure Approach: midline Location: L3-L4 Injection technique: LOR air  Needle:  Needle type: Tuohy  Needle gauge: 17 G Needle length: 9 cm and 9 Needle insertion depth: 5 cm cm Catheter type: closed end flexible Catheter size: 19 Gauge Catheter at skin depth: 10 cm Test dose: negative and Other  Assessment Sensory level: T9 Events: blood not aspirated, injection not painful, no injection resistance, negative IV test and no paresthesia  Additional Notes Reason for block:procedure for pain

## 2017-11-04 NOTE — MAU Note (Signed)
I have communicated with Dr. Henderson Cloudomblin and reviewed vital signs:  Vitals:   11/04/17 0300 11/04/17 0536  BP: 106/65 103/63  Pulse: (!) 106   Resp: 18   Temp: 97.7 F (36.5 C)   SpO2: 100%     Vaginal exam:  Dilation: 2.5 Effacement (%): 80 Cervical Position: Middle Station: -1 Presentation: Vertex Exam by:: TLytle RN,   Also reviewed contraction pattern and that non-stress test is reactive.  It has been documented that patient is contracting every 6-7 minutes with minimal cervical change over two hours not indicating active labor.  Patient denies any other complaints.  Based on this report provider has given order for discharge.  A discharge order and diagnosis entered by a provider.   Labor discharge instructions reviewed with patient.

## 2017-11-05 ENCOUNTER — Other Ambulatory Visit: Payer: Self-pay

## 2017-11-05 LAB — RPR: RPR: NONREACTIVE

## 2017-11-05 MED ORDER — PRENATAL MULTIVITAMIN CH
1.0000 | ORAL_TABLET | Freq: Every day | ORAL | Status: DC
  Administered 2017-11-05 – 2017-11-06 (×2): 1 via ORAL
  Filled 2017-11-05 (×2): qty 1

## 2017-11-05 MED ORDER — TERBUTALINE SULFATE 1 MG/ML IJ SOLN
0.2500 mg | Freq: Once | INTRAMUSCULAR | Status: DC | PRN
  Filled 2017-11-05: qty 1

## 2017-11-05 MED ORDER — COCONUT OIL OIL: 1.0000 "application " | TOPICAL_OIL | Status: DC | PRN

## 2017-11-05 MED ORDER — BENZOCAINE-MENTHOL 20-0.5 % EX AERO
1.0000 "application " | INHALATION_SPRAY | CUTANEOUS | Status: DC | PRN
  Administered 2017-11-05: 1 via TOPICAL
  Filled 2017-11-05: qty 56

## 2017-11-05 MED ORDER — ONDANSETRON HCL 4 MG PO TABS: 4.0000 mg | ORAL_TABLET | ORAL | Status: DC | PRN

## 2017-11-05 MED ORDER — CITALOPRAM HYDROBROMIDE 40 MG PO TABS
40.0000 mg | ORAL_TABLET | Freq: Every day | ORAL | Status: DC
  Administered 2017-11-06 (×2): 40 mg via ORAL
  Filled 2017-11-05 (×3): qty 1

## 2017-11-05 MED ORDER — ONDANSETRON HCL 4 MG/2ML IJ SOLN: 4.0000 mg | INTRAMUSCULAR | Status: DC | PRN

## 2017-11-05 MED ORDER — DIPHENHYDRAMINE HCL 25 MG PO CAPS: 25.0000 mg | ORAL_CAPSULE | Freq: Four times a day (QID) | ORAL | Status: DC | PRN

## 2017-11-05 MED ORDER — OXYCODONE HCL 5 MG PO TABS: 10.0000 mg | ORAL_TABLET | ORAL | Status: DC | PRN

## 2017-11-05 MED ORDER — SIMETHICONE 80 MG PO CHEW: 80.0000 mg | CHEWABLE_TABLET | ORAL | Status: DC | PRN

## 2017-11-05 MED ORDER — ACETAMINOPHEN 325 MG PO TABS
650.0000 mg | ORAL_TABLET | ORAL | Status: DC | PRN
  Administered 2017-11-06: 650 mg via ORAL
  Filled 2017-11-05: qty 2

## 2017-11-05 MED ORDER — WITCH HAZEL-GLYCERIN EX PADS: 1.0000 "application " | MEDICATED_PAD | CUTANEOUS | Status: DC | PRN

## 2017-11-05 MED ORDER — TETANUS-DIPHTH-ACELL PERTUSSIS 5-2.5-18.5 LF-MCG/0.5 IM SUSP: 0.5000 mL | Freq: Once | INTRAMUSCULAR | Status: DC

## 2017-11-05 MED ORDER — IBUPROFEN 600 MG PO TABS
600.0000 mg | ORAL_TABLET | Freq: Four times a day (QID) | ORAL | Status: DC
  Administered 2017-11-05 – 2017-11-07 (×8): 600 mg via ORAL
  Filled 2017-11-05 (×8): qty 1

## 2017-11-05 MED ORDER — OXYTOCIN 40 UNITS IN LACTATED RINGERS INFUSION - SIMPLE MED
1.0000 m[IU]/min | INTRAVENOUS | Status: DC
Start: 2017-11-05 — End: 2017-11-05

## 2017-11-05 MED ORDER — OXYCODONE HCL 5 MG PO TABS
5.0000 mg | ORAL_TABLET | ORAL | Status: DC | PRN
  Administered 2017-11-05 – 2017-11-06 (×3): 5 mg via ORAL
  Filled 2017-11-05 (×3): qty 1

## 2017-11-05 MED ORDER — SENNOSIDES-DOCUSATE SODIUM 8.6-50 MG PO TABS
2.0000 | ORAL_TABLET | ORAL | Status: DC
  Administered 2017-11-06 – 2017-11-07 (×2): 2 via ORAL
  Filled 2017-11-05 (×2): qty 2

## 2017-11-05 MED ORDER — DIBUCAINE 1 % RE OINT: 1.0000 "application " | TOPICAL_OINTMENT | RECTAL | Status: DC | PRN

## 2017-11-05 MED ORDER — ZOLPIDEM TARTRATE 5 MG PO TABS: 5.0000 mg | ORAL_TABLET | Freq: Every evening | ORAL | Status: DC | PRN

## 2017-11-05 NOTE — Lactation Note (Signed)
This note was copied from a baby's chart. Lactation Consultation Note  Patient Name: Patty [name] ZOXWR'UToday's Date: 11/05/2017 Reason for consult: Initial assessment    P1, Baby 7 hours old.  Reviewed hand expression and baby was spoon fed drops. Parents have been spoon feeding. Attempted latching but baby sleepy. Encouraged STS. Mom encouraged to feed baby 8-12 times/24 hours and with feeding cues.  Reviewed basics. Mom made aware of O/P services, breastfeeding support groups, community resources, and our phone # for post-discharge questions.             Maternal Data Has patient been taught Hand Expression?: Yes Does the patient have breastfeeding experience prior to this delivery?: Yes  Feeding    LATCH Score                   Interventions Interventions: Breast feeding basics reviewed  Lactation Tools Discussed/Used     Consult Status Consult Status: Follow-up Date: 11/06/17 Follow-up type: In-patient    Dahlia ByesBerkelhammer, Sharnise Blough [facility] 11/05/2017, 3:00 PM

## 2017-11-05 NOTE — Progress Notes (Signed)
Operative Delivery Note At 7:05 AM a viable female was delivered via Vaginal, Vacuum Investment banker, operational(Extractor).  Presentation: vertex; Position: Right,, Occiput,, Anterior; Station: +4.  Verbal consent: obtained from patient.  Risks and benefits discussed in detail.  Risks include, but are not limited to the risks of anesthesia, bleeding, infection, damage to maternal tissues, fetal cephalhematoma.  There is also the risk of inability to effect vaginal delivery of the head, or shoulder dystocia that cannot be resolved by established maneuvers, leading to the need for emergency cesarean section.  Patient exhausted, vtx +4 Mushroom VE applied, no popoffs, delivered with 1 UC >VMI, newborn HR >100 and good tone but would not cry. Taken to table and stimulated with HR always >100, NICU called and bagged several breaths with good air movement. NICU team arrives. Baby evaluated and left in room with patient.  APGAR: 3, 8; weight  .   Placenta status:intact , .   Cord:3 vessels  with the following complications:none .  Cord pH: 7.22  Anesthesia:  Epidural, lidocaine local Instruments: mushroom VE Episiotomy: None Lacerations: 3rd degree ML laceration, repaired, rectum checked Suture Repair: 3.0 vicryl rapide Est. Blood Loss (mL): 350  Mom to postpartum.  Baby to Couplet care / Skin to Skin.  Roselle LocusJames E Laydon Martis II 11/05/2017, 7:36 AM

## 2017-11-05 NOTE — Anesthesia Postprocedure Evaluation (Signed)
Anesthesia Post Note  Patient: Patty Castillo  Procedure(s) Performed: AN AD HOC LABOR EPIDURAL     Patient location during evaluation: Mother Baby Anesthesia Type: Epidural Level of consciousness: awake and alert and oriented Pain management: satisfactory to patient Vital Signs Assessment: post-procedure vital signs reviewed and stable Respiratory status: spontaneous breathing and nonlabored ventilation Cardiovascular status: stable Postop Assessment: no headache, no backache, no signs of nausea or vomiting, adequate PO intake, patient able to bend at knees and able to ambulate (patient up walking) Anesthetic complications: no    Last Vitals:  Vitals:   11/05/17 0910 11/05/17 1010  BP: 105/66 102/68  Pulse: 85 92  Resp: 18 16  Temp: 37 C 37.3 C  SpO2: 97% 97%    Last Pain:  Vitals:   11/05/17 1157  TempSrc:   PainSc: 5    Pain Goal: Patients Stated Pain Goal: 2 (11/05/17 1157)               Madison HickmanGREGORY,Jowana Thumma

## 2017-11-05 NOTE — Progress Notes (Signed)
CSW received consult for patient to history of anxiety. CSW completed thorough chart review, patient is currently medicated with Celexa. Per recent prenatal note, patient's dose was 30mg  of Celexa daily. CSW screened out consult. Please re-consult if MOB requests, scores a ten or higher on her EPDS, or answers yes to question 10 on EPDS.  Edwin Dadaarol Rajesh Wyss, MSW, LCSW-A Clinical Social Worker [facility] [phone]

## 2017-11-06 LAB — CBC
HEMATOCRIT: 28.1 % — AB (ref 36.0–46.0)
Hemoglobin: 9.2 g/dL — ABNORMAL LOW (ref 12.0–15.0)
MCH: 31.5 pg (ref 26.0–34.0)
MCHC: 32.7 g/dL (ref 30.0–36.0)
MCV: 96.2 fL (ref 78.0–100.0)
PLATELETS: 178 10*3/uL (ref 150–400)
RBC: 2.92 MIL/uL — ABNORMAL LOW (ref 3.87–5.11)
RDW: 12.9 % (ref 11.5–15.5)
WBC: 13.7 10*3/uL — AB (ref 4.0–10.5)

## 2017-11-06 NOTE — Progress Notes (Signed)
Post Partum Day 1 Subjective: no complaints, up ad lib, voiding and tolerating PO. Desires circ.  Objective: Blood pressure (!) 100/56, pulse 72, temperature 98.1 F (36.7 C), temperature source Oral, resp. rate 14, SpO2 97 %, currently breastfeeding.  Physical Exam:  General: alert, cooperative and appears stated age Lochia: appropriate Uterine Fundus: firm Incision: healing well, no significant drainage, no dehiscence DVT Evaluation: No evidence of DVT seen on physical exam. Negative Homan's sign. No cords or calf tenderness.  Recent Labs    11/04/17 2044 11/06/17 0526  HGB 12.3 9.2*  HCT 36.0 28.1*    Assessment/Plan: Plan for discharge tomorrow and Circumcision prior to discharge  Patient counseled re: circ including risk of bleeding, infection, and scarring.  All questions were answered and the patient wishes to proceed.   LOS: 2 days   Patty Castillo 11/06/2017, 8:52 AM

## 2017-11-06 NOTE — Lactation Note (Signed)
This note was copied from a baby's chart. Lactation Consultation Note  Patient Name: Patty Castillo Reason for consult: Follow-up assessment;Difficult latch  P2 mother requesting latch assistance.  Mother had baby in cross cradle position and attempting to latch as I arrived.  I could see he was not latched on deeply enough and was not actively sucking.  I suggested the football position and mother agreed to try.  Assisted Arita MissDawson to latch in the football hold on the right breast.  After a few attempts he latched and began rhythmic sucking.  With breast compressions, many swallows were heard.  Reminded mother to keep fingers back from nipple/areola during latch.  Discussed how to maintain a deep effective latch at the breast.  Arita Missawson fed for 20 minutes on this breast.  After he released mother burped him and then I assisted with latching in the football hold onto the left breast.  Again, I reminded mother to keep fingers back and to periodically do breast compressions.  Baby is much more relaxed and swallows heard.  He fed on this breast for 10 minutes before releasing.  Mother's breasts were much softer after this feeding.  She stated that the latch felt much better with assistance.  I believe baby was not effectively latching and transferring milk.    Encouraged mother to feed 8-12 times/24 hours or earlier if he showed feeding cues.  Continue STS, breast massage and hand expression.  Mother was going to pump after this feeding with the manual pump.  She felt the difference in her breasts before/after feeding.  Father present and supportive; both parents eager to learn.  Mother will call for assistance as needed.   Maternal Data Formula Feeding for Exclusion: No Has patient been taught Hand Expression?: Yes Does the patient have breastfeeding experience prior to this delivery?: Yes  Feeding Feeding Type: Breast Fed Length of feed: 30 min  LATCH Score Latch:  Grasps breast easily, tongue down, lips flanged, rhythmical sucking.  Audible Swallowing: Spontaneous and intermittent  Type of Nipple: Everted at rest and after stimulation  Comfort (Breast/Nipple): Soft / non-tender  Hold (Positioning): Assistance needed to correctly position infant at breast and maintain latch.  LATCH Score: 9  Interventions Interventions: Breast feeding basics reviewed;Assisted with latch;Skin to skin;Breast massage;Hand express;Position options;Support pillows;Adjust position;Breast compression;Hand pump  Lactation Tools Discussed/Used     Consult Status Consult Status: Follow-up Date: 11/07/17 Follow-up type: In-patient    Dora SimsBeth R Stacie Templin Castillo, 9:17 PM

## 2017-11-06 NOTE — Progress Notes (Signed)
CSW received consult due to score 19 on Edinburgh Depression Screen.    When CSW arrived, MOB was resting in bed and bonding with infant as evidence by MOB engaging in infant massages.  MOB appeared comfortable and was receptive to meeting with CSW.  Initially MOB was tearful, but throughout the assessment, MOB's improved and MOB demonstrated smiles and communicated excitement about being a new mother again.   CSW reviewed MOB's Edinburgh results and MOB was able to communicate family drama that has triggered an increase in MOB's anxiety and depression. MOB shared that FOB's family are highly upset with MOB and FOB because of the name that they have chosen for infant.  MOB stated, "Years ago, David (FOB) and I decided that if we were to ever have a son we would name him Dawson; means SON OF DAVID. Now that we have our son, David's family is upset because David sister had a miscarriage 10 years ago at 10 weeks and named the fetus Dawson."  CSW provided therapeutic listening and validated and normalized MOB's thoughts and feelings. Although MOB and FOB have made their name decision, CSW provided supportive listening while MOB explained their process of infant's name conclusion.   CSW provided education regarding Baby Blues vs PMADs.  CSW encouraged MOB to evaluate her mental health throughout the postpartum period with the use of the New Mom Checklist developed by Postpartum Progress and notify a medical professional if symptoms arise.  MOB reports having a therapist and psychiatric and have scheduled appointments with them in the near future.  CSW also suggested that MOB follow-up with OB provider in 3-4 weeks as opposed to 6 weeks and MOB agreed.   CSW assessed for safety and MOB denied SI and HI.  MOB presented with insight and awareness and communicated feeling confident seeking help if help is needed.   There are no barriers to discharge.   Patty Castillo, MSW, LCSW Clinical Social  Work [phone] 

## 2017-11-07 ENCOUNTER — Ambulatory Visit: Payer: Self-pay

## 2017-11-07 MED ORDER — IBUPROFEN 600 MG PO TABS: 600.0000 mg | ORAL_TABLET | Freq: Four times a day (QID) | ORAL | 0 refills | Status: DC

## 2017-11-07 MED ORDER — OXYCODONE-ACETAMINOPHEN 5-325 MG PO TABS: 1.0000 | ORAL_TABLET | ORAL | 0 refills | Status: DC | PRN

## 2017-11-07 NOTE — Lactation Note (Signed)
This note was copied from a baby's chart. Lactation Consultation Note  Patient Name: Patty [name] FAOZH'YToday's Date: 11/07/2017 Reason for consult: Follow-up assessment 51 hour female  Mom previous breastfeed other siblings.  Was breastfeeding infant as LC entered room 20 mins and additional 10 mins. LC help correct cross cradle hold and Mom switched to football hold last 10 minutes of feeding. Mom informed to feed infant 8 to 12 times within 24 hours. Mom is aware of stools transition current infant has greenish to brown stool as reported by Mom and aware stool with switch day 4 to 5 yellow seedy color. Mom been using pump and is pleased with her supply. Aware of ways to alleviate engorgement.  Maternal Data    Feeding Feeding Type: Breast Fed Length of feed: 30 min  LATCH Score                   Interventions    Lactation Tools Discussed/Used     Consult Status Consult Status: Complete    Danelle EarthlyRobin Lauria Depoy 11/07/2017, 11:53 AM

## 2017-11-07 NOTE — Discharge Summary (Signed)
Obstetric Discharge Summary Reason for Admission: onset of labor Prenatal Procedures: ultrasound Intrapartum Procedures: vacuum Postpartum Procedures: none Complications-Operative and Postpartum: none Hemoglobin  Date Value Ref Range Status  11/06/2017 9.2 (L) 12.0 - 15.0 g/dL Final    Comment:    REPEATED TO VERIFY DELTA CHECK NOTED    HCT  Date Value Ref Range Status  11/06/2017 28.1 (L) 36.0 - 46.0 % Final    Physical Exam:  General: alert and cooperative Lochia: appropriate Uterine Fundus: firm Incision: healing well DVT Evaluation: No evidence of DVT seen on physical exam.  Discharge Diagnoses: Term Pregnancy-delivered  Discharge Information: Date: 11/07/2017 Activity: pelvic rest Diet: routine Medications: PNV, Ibuprofen, Colace and Percocet Condition: stable Instructions: refer to practice specific booklet Discharge to: home Follow-up Information    [place] Schedule an appointment as soon as possible for a visit in 3 week(s).   Contact information: [address] [place] [id number] [phone]           Newborn Data: Live born female  Birth Weight: 9 lb 4.9 oz (4221 g) APGAR: 3, 8  Newborn Delivery   Birth date/time:  11/05/2017 07:05:00 Delivery type:  Vaginal, Vacuum (Extractor)     Home with mother.  Patty Castillo 11/07/2017, 10:23 AM

## 2017-11-09 ENCOUNTER — Inpatient Hospital Stay (HOSPITAL_COMMUNITY): Admission: RE | Admit: 2017-11-09 | Payer: Commercial Managed Care - PPO | Source: Ambulatory Visit

## 2017-11-15 ENCOUNTER — Inpatient Hospital Stay (HOSPITAL_COMMUNITY): Payer: Commercial Managed Care - PPO

## 2018-01-15 ENCOUNTER — Other Ambulatory Visit: Payer: Self-pay | Admitting: Obstetrics and Gynecology

## 2018-01-15 ENCOUNTER — Other Ambulatory Visit: Payer: Self-pay | Admitting: Obstetrics & Gynecology

## 2018-01-15 DIAGNOSIS — Z1231 Encounter for screening mammogram for malignant neoplasm of breast: Secondary | ICD-10-CM

## 2018-01-26 ENCOUNTER — Ambulatory Visit
Admission: RE | Admit: 2018-01-26 | Discharge: 2018-01-26 | Disposition: A | Discharge status: Home / Self Care | Payer: Commercial Managed Care - PPO | Source: Ambulatory Visit | Attending: Obstetrics & Gynecology | Admitting: Obstetrics & Gynecology

## 2018-01-26 DIAGNOSIS — Z1231 Encounter for screening mammogram for malignant neoplasm of breast: Secondary | ICD-10-CM

## 2019-02-25 ENCOUNTER — Ambulatory Visit (HOSPITAL_COMMUNITY): Payer: Self-pay | Admitting: Psychiatry

## 2019-03-04 ENCOUNTER — Ambulatory Visit (HOSPITAL_COMMUNITY): Payer: Commercial Managed Care - PPO | Admitting: Psychiatry

## 2019-03-11 ENCOUNTER — Encounter (HOSPITAL_COMMUNITY): Payer: Self-pay | Admitting: Psychiatry

## 2019-03-11 ENCOUNTER — Ambulatory Visit (INDEPENDENT_AMBULATORY_CARE_PROVIDER_SITE_OTHER): Payer: Commercial Managed Care - PPO | Admitting: Psychiatry

## 2019-03-11 DIAGNOSIS — F410 Panic disorder [episodic paroxysmal anxiety] without agoraphobia: Secondary | ICD-10-CM | POA: Diagnosis not present

## 2019-03-11 DIAGNOSIS — F330 Major depressive disorder, recurrent, mild: Secondary | ICD-10-CM

## 2019-03-11 DIAGNOSIS — F411 Generalized anxiety disorder: Secondary | ICD-10-CM | POA: Diagnosis not present

## 2019-03-11 NOTE — Progress Notes (Signed)
Psychiatric Initial Adult Assessment   Patient Identification: Patty Castillo MRN:  [id number] Date of Evaluation:  03/11/2019 Referral Source: OBGYN Chief Complaint:   Chief Complaint    Anxiety; Establish Care     Visit Diagnosis:    ICD-10-CM   1. GAD (generalized anxiety disorder)  F41.1   2. Panic anxiety syndrome  F41.0   3. MDD (major depressive disorder), recurrent episode, mild (HCC)  F33.0   I connected with [name] on 03/11/19 at 11:00 AM EDT by a video enabled telemedicine application and verified that I am speaking with the correct person using two identifiers.   I discussed the limitations of evaluation and management by telemedicine and the availability of in person appointments. The patient expressed understanding and agreed to proceed.  History of Present Illness: Patient is a 33 years old currently married Caucasian female referred by OB/GYN for management of anxiety and depression  She has suffered anxiety  and panic attacks in the past starting when she was in college stressed out.  She felt her heart palpitation extreme anxiety and the more she thought about it made her more anxious.she had difficult sleeping at night and felt she would not sleep, it triggered more anxiety and panic.  She had to be seen by urgent care.  She was started on Celexa and since then she has been on citalopram added a small higher dose.  Depending on the anxiety level medication has been adjusted.  In the past she would have apprehension of anxiety worries and of having another panic attack that would make her more worriful There were incidences later of sleep disturbance or anxiety overgeneralizing to panic or episode  She felt anxiety would lead to depression.  She has moved from [place] and now living here feels at baseline that is helped she kept celexa while she was pregnant 2 times as well  At the current dose of 40 mg she feels comfortable not having  panic attacks her anxiety is manageable does not endorse hopelessness or depression She also has refills available and there is no reported side effects She has tried to cut down and change the medication to a lower dose but it would not work so she feels comfortable with the current medication dose  There is no manic symptoms in the past there is no psychotic symptoms in the past Denies drug use Family history of bipolar and depression No past psychiatric admission or suicide attempt  Aggravating factor: not known, Modifying factor: husband, kids  Duration more then 5 years     Past Psychiatric History: depression, anxiety  Previous Psychotropic Medications: No   Substance Abuse History in the last 12 months:  No.  Consequences of Substance Abuse: NA  Past Medical History:  Past Medical History:  Diagnosis Date  . Anxiety   . Depression   . Hx of varicella     Past Surgical History:  Procedure Laterality Date  . NO PAST SURGERIES      Family Psychiatric History: Uncle Bipolar, GM depression  Family History:  Family History  Problem Relation Age of Onset  . Cancer Mother        breast  . Hypothyroidism Mother   . Breast cancer Mother   . Hypertension Father   . Depression Sister   . Alzheimer's disease Maternal Grandmother   . Diabetes Maternal Grandfather   . Mental illness Paternal Grandmother   . Depression Paternal Grandmother   . Cancer Paternal Grandmother  breast  . Breast cancer Paternal Grandmother   . Cancer Paternal Grandfather   . Hypothyroidism Maternal Aunt     Social History:   Social History   Socioeconomic History  . Marital status: Married    Spouse name: Not on file  . Number of children: Not on file  . Years of education: Not on file  . Highest education level: Not on file  Occupational History  . Not on file  Social Needs  . Financial resource strain: Not on file  . Food insecurity    Worry: Not on file    Inability:  Not on file  . Transportation needs    Medical: Not on file    Non-medical: Not on file  Tobacco Use  . Smoking status: Never Smoker  . Smokeless tobacco: Never Used  Substance and Sexual Activity  . Alcohol use: No  . Drug use: No  . Sexual activity: Yes    Birth control/protection: None  Lifestyle  . Physical activity    Days per week: Not on file    Minutes per session: Not on file  . Stress: Not on file  Relationships  . Social Musicianconnections    Talks on phone: Not on file    Gets together: Not on file    Attends religious service: Not on file    Active member of club or organization: Not on file    Attends meetings of clubs or organizations: Not on file    Relationship status: Not on file  Other Topics Concern  . Not on file  Social History Narrative  . Not on file    Additional Social History[place]   Allergies:   Allergies  Allergen Reactions  . Doxycycline Nausea And Vomiting  . Latex Hives and Rash    Metabolic Disorder Labs: No results found for: HGBA1C, MPG No results found for: PROLACTIN No results found for: CHOL, TRIG, HDL, CHOLHDL, VLDL, LDLCALC No results found for: TSH  Therapeutic Level Labs: No results found for: LITHIUM No results found for: CBMZ No results found for: VALPROATE  Current Medications: Current Outpatient Medications  Medication Sig Dispense Refill  . citalopram (CELEXA) 20 MG tablet Take 40 mg by mouth at bedtime.    Marland Kitchen. ibuprofen (ADVIL,MOTRIN) 600 MG tablet Take 1 tablet (600 mg total) by mouth every 6 (six) hours. 30 tablet 0  . oxyCODONE-acetaminophen (PERCOCET/ROXICET) 5-325 MG tablet Take 1-2 tablets by mouth every 4 (four) hours as needed for severe pain. 15 tablet 0  . prenatal vitamin w/FE, FA (PRENATAL 1 + 1) 27-1 MG TABS tablet Take 1 tablet by mouth at bedtime.      No current facility-administered medications for this visit.     Musculoskeletal:   Psychiatric Specialty  Exam: Review of Systems  Cardiovascular: Negative for chest pain.  Skin: Negative for rash.  Psychiatric/Behavioral: Negative for depression, substance abuse and suicidal ideas.    currently breastfeeding.There is no height or weight on file to calculate BMI.  General Appearance: Casual  Eye Contact:  Fair  Speech:  Normal Rate  Volume:  Normal  Mood:  Euthymic  Affect:  Congruent  Thought Process:  Irrelevant  Orientation:  Full (Time, Place, and Person)  Thought Content:  Logical  Suicidal Thoughts:  No  Homicidal Thoughts:  No  Memory:  Immediate;   Fair Recent;   Good  Judgement:  Fair  Insight:  Fair  Psychomotor Activity:  Normal  Concentration:  Concentration: Fair and Attention Span: Fair  Recall:  Good  Fund of Knowledge:Good  Language: Good  Akathisia:  No  Handed:  Right  AIMS (if indicated):  not done  Assets:  Financial Resources/Insurance Physical Health  ADL's:  Intact  Cognition: WNL  Sleep:  Fair   Screenings:   Assessment and Plan: as follows  MDD mild: continue celexa GAD with panic attacks: doing stable. Continue celexa 40mg  has refills Discussed if need therapy and work on Radiographer, therapeutic. Says she is doing fine   I discussed the assessment and treatment plan with the patient. The patient was provided an opportunity to ask questions and all were answered. The patient agreed with the plan and demonstrated an understanding of the instructions.   The patient was advised to call back or seek an in-person evaluation if the symptoms worsen or if the condition fails to improve as anticipated.  FU 16m, if remains stable then we can fu every 6 months  Merian Capron, MD 10/12/202011:26 AM

## 2019-05-16 ENCOUNTER — Ambulatory Visit (INDEPENDENT_AMBULATORY_CARE_PROVIDER_SITE_OTHER): Payer: Commercial Managed Care - PPO | Admitting: Psychiatry

## 2019-05-16 ENCOUNTER — Encounter (HOSPITAL_COMMUNITY): Payer: Self-pay | Admitting: Psychiatry

## 2019-05-16 DIAGNOSIS — F410 Panic disorder [episodic paroxysmal anxiety] without agoraphobia: Secondary | ICD-10-CM | POA: Diagnosis not present

## 2019-05-16 DIAGNOSIS — F411 Generalized anxiety disorder: Secondary | ICD-10-CM

## 2019-05-16 DIAGNOSIS — F330 Major depressive disorder, recurrent, mild: Secondary | ICD-10-CM | POA: Diagnosis not present
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
## 2019-10-07 ENCOUNTER — Telehealth (INDEPENDENT_AMBULATORY_CARE_PROVIDER_SITE_OTHER): Payer: Commercial Managed Care - PPO | Admitting: Psychiatry

## 2019-10-07 ENCOUNTER — Encounter (HOSPITAL_COMMUNITY): Payer: Self-pay | Admitting: Psychiatry

## 2019-10-07 DIAGNOSIS — F330 Major depressive disorder, recurrent, mild: Secondary | ICD-10-CM

## 2019-10-07 DIAGNOSIS — F411 Generalized anxiety disorder: Secondary | ICD-10-CM | POA: Diagnosis not present

## 2019-10-07 DIAGNOSIS — F5102 Adjustment insomnia: Secondary | ICD-10-CM

## 2019-10-07 DIAGNOSIS — F410 Panic disorder [episodic paroxysmal anxiety] without agoraphobia: Secondary | ICD-10-CM

## 2019-10-07 MED ORDER — TRAZODONE HCL 50 MG PO TABS: 50.0000 mg | ORAL_TABLET | Freq: Every evening | ORAL | 0 refills | Status: DC | PRN

## 2019-10-07 NOTE — Progress Notes (Signed)
[facility] Follow up visit  Patient Identification: Patty Castillo MRN:  [id number] Date of Evaluation:  10/07/2019 Referral Source: OBGYN Chief Complaint:   anxiety, poor sleep Visit Diagnosis:    ICD-10-CM   1. GAD (generalized anxiety disorder)  F41.1   2. Panic anxiety syndrome  F41.0   3. MDD (major depressive disorder), recurrent episode, mild (HCC)  F33.0   4. Adjustment insomnia  F51.02      I connected with Patty Castillo on 10/07/19 at  3:00 PM EDT by a video enabled telemedicine application and verified that I am speaking with the correct person using two identifiers.       I discussed the limitations of evaluation and management by telemedicine and the availability of in person appointments. The patient expressed understanding and agreed to proceed.  History of Present Illness: Patient is a 34 years old currently married Caucasian female referred by OB/GYN for management of anxiety and depression  She has suffered anxiety  and panic attacks in the past starting when she was in college stressed out  They have relocated from [place] and celexa has been working  For mothers day she spend night in hotel away from kids for a break, coudnt sleep Now feeling anxious and worried if she would be able to sleep   she kept celexa while she was pregnant 2 times as well Family history of bipolar and depression No past psychiatric admission or suicide attempt  Aggravating factor: not known, college stress in the past Modifying factor: husband, kids Duration 5 years plus   Past Psychiatric History: depression, anxiety  Previous Psychotropic Medications: No   Substance Abuse History in the last 12 months:  No.  Consequences of Substance Abuse: NA  Past Medical History:  Past Medical History:  Diagnosis Date  . Anxiety   . Depression   . Hx of varicella     Past Surgical History:  Procedure Laterality Date  . NO PAST SURGERIES      Family Psychiatric  History: Uncle Bipolar, GM depression  Family History:  Family History  Problem Relation Age of Onset  . Cancer Mother        breast  . Hypothyroidism Mother   . Breast cancer Mother   . Hypertension Father   . Depression Sister   . Alzheimer's disease Maternal Grandmother   . Diabetes Maternal Grandfather   . Mental illness Paternal Grandmother   . Depression Paternal Grandmother   . Cancer Paternal Grandmother        breast  . Breast cancer Paternal Grandmother   . Cancer Paternal Grandfather   . Hypothyroidism Maternal Aunt     Social History:   Social History   Socioeconomic History  . Marital status: Married    Spouse name: Not on file  . Number of children: Not on file  . Years of education: Not on file  . Highest education level: Not on file  Occupational History  . Not on file  Tobacco Use  . Smoking status: Never Smoker  . Smokeless tobacco: Never Used  Substance and Sexual Activity  . Alcohol use: No  . Drug use: No  . Sexual activity: Yes    Birth control/protection: None  Other Topics Concern  . Not on file  Social History Narrative  . Not on file   Social Determinants of Health   Financial Resource Strain:   . Difficulty of Paying Living Expenses:   Food Insecurity:   . Worried About Radiation protection practitioner  of Food in the Last Year:   . [place] in the Last Year:   Transportation Needs:   . Lack of Transportation (Medical):   Marland Kitchen Lack of Transportation (Non-Medical):   Physical Activity:   . Days of Exercise per Week:   . Minutes of Exercise per Session:   Stress:   . Feeling of Stress :   Social Connections:   . Frequency of Communication with Friends and Family:   . Frequency of Social Gatherings with Friends and Family:   . Attends Religious Services:   . Active Member of Clubs or Organizations:   . Attends Archivist Meetings:   Marland Kitchen Marital Status:     Additional Social History: Growing was good in [place], no  abuse   Allergies:   Allergies  Allergen Reactions  . Doxycycline Nausea And Vomiting  . Latex Hives and Rash    Metabolic Disorder Labs: No results found for: HGBA1C, MPG No results found for: PROLACTIN No results found for: CHOL, TRIG, HDL, CHOLHDL, VLDL, LDLCALC No results found for: TSH  Therapeutic Level Labs: No results found for: LITHIUM No results found for: CBMZ No results found for: VALPROATE  Current Medications: Current Outpatient Medications  Medication Sig Dispense Refill  . citalopram (CELEXA) 20 MG tablet Take 40 mg by mouth at bedtime.    Marland Kitchen ibuprofen (ADVIL,MOTRIN) 600 MG tablet Take 1 tablet (600 mg total) by mouth every 6 (six) hours. 30 tablet 0  . oxyCODONE-acetaminophen (PERCOCET/ROXICET) 5-325 MG tablet Take 1-2 tablets by mouth every 4 (four) hours as needed for severe pain. 15 tablet 0  . prenatal vitamin w/FE, FA (PRENATAL 1 + 1) 27-1 MG TABS tablet Take 1 tablet by mouth at bedtime.     . traZODone (DESYREL) 50 MG tablet Take 1 tablet (50 mg total) by mouth at bedtime as needed for sleep. 30 tablet 0   No current facility-administered medications for this visit.    Musculoskeletal:   Psychiatric Specialty Exam: Review of Systems  Cardiovascular: Negative for chest pain.  Skin: Negative for rash.  Psychiatric/Behavioral: Negative for depression, substance abuse and suicidal ideas. The patient has insomnia.     currently breastfeeding.There is no height or weight on file to calculate BMI.  General Appearance: Casual  Eye Contact:  Fair  Speech:  Normal Rate  Volume:  Normal  Mood: somewhat anxious  Affect:  Congruent  Thought Process:  Irrelevant  Orientation:  Full (Time, Place, and Person)  Thought Content:  Logical  Suicidal Thoughts:  No  Homicidal Thoughts:  No  Memory:  Immediate;   Fair Recent;   Good  Judgement:  Fair  Insight:  Fair  Psychomotor Activity:  Normal  Concentration:  Concentration: Fair and Attention Span: Fair   Recall:  Good  Fund of Knowledge:Good  Language: Good  Akathisia:  No  Handed:  Right  AIMS (if indicated):  not done  Assets:  Financial Resources/Insurance Physical Health  ADL's:  Intact  Cognition: WNL  Sleep:  Fair   Screenings:   Assessment and Plan: as follows  MDD mild: doing fair continue celexa GAD with panic attacks: feeling anxious says due to poor sleeep, continue celexa Will consider trazadone has helped her before Consider therapy and distractions  Insomnia: reviewed sleep hygiene, add trazadone 50mg  have used before.  I discussed the assessment and treatment plan with the patient. The patient was provided an opportunity to ask questions and all were answered. The patient agreed with  the plan and demonstrated an understanding of the instructions.   The patient was advised to call back or seek an in-person evaluation if the symptoms worsen or if the condition fails to improve as anticipated. Fu 29m or earlier if needed Time spent non face to face or less  Thresa Ross, MD 5/10/20213:08 PM

## 2019-11-02 ENCOUNTER — Other Ambulatory Visit (HOSPITAL_COMMUNITY): Payer: Self-pay | Admitting: Psychiatry

## 2019-11-08 ENCOUNTER — Other Ambulatory Visit: Payer: Self-pay

## 2019-11-08 ENCOUNTER — Telehealth (INDEPENDENT_AMBULATORY_CARE_PROVIDER_SITE_OTHER): Payer: Commercial Managed Care - PPO | Admitting: Psychiatry

## 2019-11-08 ENCOUNTER — Encounter (HOSPITAL_COMMUNITY): Payer: Self-pay | Admitting: Psychiatry

## 2019-11-08 DIAGNOSIS — F410 Panic disorder [episodic paroxysmal anxiety] without agoraphobia: Secondary | ICD-10-CM

## 2019-11-08 DIAGNOSIS — F411 Generalized anxiety disorder: Secondary | ICD-10-CM | POA: Diagnosis not present

## 2019-11-08 DIAGNOSIS — F330 Major depressive disorder, recurrent, mild: Secondary | ICD-10-CM

## 2019-11-08 MED ORDER — CITALOPRAM HYDROBROMIDE 20 MG PO TABS: 40.0000 mg | ORAL_TABLET | Freq: Every day | ORAL | 0 refills | Status: DC

## 2019-11-08 MED ORDER — HYDROXYZINE HCL 10 MG PO TABS: 10.0000 mg | ORAL_TABLET | Freq: Every evening | ORAL | 0 refills | Status: DC | PRN

## 2019-11-08 NOTE — Progress Notes (Addendum)
[facility] Follow up visit  Patient Identification: Patty Castillo MRN:  [id number] Date of Evaluation:  11/08/2019 Referral Source: OBGYN Chief Complaint:    anxiety  Visit Diagnosis:    ICD-10-CM   1. GAD (generalized anxiety disorder)  F41.1   2. Panic anxiety syndrome  F41.0   3. MDD (major depressive disorder), recurrent episode, mild (HCC)  F33.0        I connected with [name] on 11/08/19 at 10:00 AM EDT by a video enabled telemedicine application and verified that I am speaking with the correct person using two identifiers.     I discussed the limitations of evaluation and management by telemedicine and the availability of in person appointments. The patient expressed understanding and agreed to proceed. Patient location ; home Provider location: home office  History of Present Illness: Patient is a 34 years old currently married Caucasian female referred by OB/GYN for management of anxiety and depression  She has suffered anxiety  and panic attacks in the past starting when she was in college stressed out  They have relocated from [place] and celexa has been working  For mothers day she spend night in hotel away from kids for a break, coudnt sleep   trazadone helped some since last visit Still stressed a times, feels jittery feeling Sleep is fair    she kept celexa while she was pregnant 2 times as well Family history of bipolar and depression No past psychiatric admission or suicide attempt  Aggravating factor: not known, college stress in the past Modifying factor: husband,kids  Duration 5 years plus   Past Psychiatric History: depression, anxiety  Previous Psychotropic Medications: No   Substance Abuse History in the last 12 months:  No.  Consequences of Substance Abuse: NA  Past Medical History:  Past Medical History:  Diagnosis Date  . Anxiety   . Depression   . Hx of varicella     Past Surgical History:  Procedure  Laterality Date  . NO PAST SURGERIES      Family Psychiatric History: Uncle Bipolar, GM depression  Family History:  Family History  Problem Relation Age of Onset  . Cancer Mother        breast  . Hypothyroidism Mother   . Breast cancer Mother   . Hypertension Father   . Depression Sister   . Alzheimer's disease Maternal Grandmother   . Diabetes Maternal Grandfather   . Mental illness Paternal Grandmother   . Depression Paternal Grandmother   . Cancer Paternal Grandmother        breast  . Breast cancer Paternal Grandmother   . Cancer Paternal Grandfather   . Hypothyroidism Maternal Aunt     Social History:   Social History   Socioeconomic History  . Marital status: Married    Spouse name: Not on file  . Number of children: Not on file  . Years of education: Not on file  . Highest education level: Not on file  Occupational History  . Not on file  Tobacco Use  . Smoking status: Never Smoker  . Smokeless tobacco: Never Used  Substance and Sexual Activity  . Alcohol use: No  . Drug use: No  . Sexual activity: Yes    Birth control/protection: None  Other Topics Concern  . Not on file  Social History Narrative  . Not on file   Social Determinants of Health   Financial Resource Strain:   . Difficulty of Paying Living Expenses:   Food Insecurity:   .  Worried About Programme researcher, broadcasting/film/video in the Last Year:   . Barista in the Last Year:   Transportation Needs:   . Freight forwarder (Medical):   Marland Kitchen Lack of Transportation (Non-Medical):   Physical Activity:   . Days of Exercise per Week:   . Minutes of Exercise per Session:   Stress:   . Feeling of Stress :   Social Connections:   . Frequency of Communication with Friends and Family:   . Frequency of Social Gatherings with Friends and Family:   . Attends Religious Services:   . Active Member of Clubs or Organizations:   . Attends Banker Meetings:   Marland Kitchen Marital Status:     Additional  Social History: Growing was good in [place], no abuse   Allergies:   Allergies  Allergen Reactions  . Doxycycline Nausea And Vomiting  . Latex Hives and Rash    Metabolic Disorder Labs: No results found for: HGBA1C, MPG No results found for: PROLACTIN No results found for: CHOL, TRIG, HDL, CHOLHDL, VLDL, LDLCALC No results found for: TSH  Therapeutic Level Labs: No results found for: LITHIUM No results found for: CBMZ No results found for: VALPROATE  Current Medications: Current Outpatient Medications  Medication Sig Dispense Refill  . citalopram (CELEXA) 20 MG tablet Take 2 tablets (40 mg total) by mouth at bedtime. 30 tablet 0  . hydrOXYzine (ATARAX/VISTARIL) 10 MG tablet Take 1 tablet (10 mg total) by mouth at bedtime as needed. 30 tablet 0  . ibuprofen (ADVIL,MOTRIN) 600 MG tablet Take 1 tablet (600 mg total) by mouth every 6 (six) hours. 30 tablet 0  . oxyCODONE-acetaminophen (PERCOCET/ROXICET) 5-325 MG tablet Take 1-2 tablets by mouth every 4 (four) hours as needed for severe pain. 15 tablet 0  . prenatal vitamin w/FE, FA (PRENATAL 1 + 1) 27-1 MG TABS tablet Take 1 tablet by mouth at bedtime.     . traZODone (DESYREL) 50 MG tablet TAKE ONE TABLET BY MOUTH EVERY NIGHT AT BEDTIME AS NEEDED FOR SLEEP 30 tablet 0   No current facility-administered medications for this visit.    Musculoskeletal:   Psychiatric Specialty Exam: Review of Systems  Cardiovascular: Negative for chest pain.  Skin: Negative for rash.  Psychiatric/Behavioral: Negative for depression, substance abuse and suicidal ideas.    currently breastfeeding.There is no height or weight on file to calculate BMI.  General Appearance: Casual  Eye Contact:  Fair  Speech:  Normal Rate  Volume:  Normal  Mood: somewhat anxious  Affect:  Congruent  Thought Process:  Irrelevant  Orientation:  Full (Time, Place, and Person)  Thought Content:  Logical  Suicidal Thoughts:  No  Homicidal Thoughts:  No   Memory:  Immediate;   Fair Recent;   Good  Judgement:  Fair  Insight:  Fair  Psychomotor Activity:  Normal  Concentration:  Concentration: Fair and Attention Span: Fair  Recall:  Good  Fund of Knowledge:Good  Language: Good  Akathisia:  No  Handed:  Right  AIMS (if indicated):  not done  Assets:  Financial Resources/Insurance Physical Health  ADL's:  Intact  Cognition: WNL  Sleep:  Fair   Screenings:   Assessment and Plan: as follows  MDD mild: doing fair continue celexa GAD with panic attacks: feeling jittery but satisfied with celexa dose. Will add hydroxyzine prn for anxiety or jittery feeling   Consider therapy and distractions  Insomnia: reviewed sleep hygiene, takes trazadone prn , can continue  I  discussed the assessment and treatment plan with the patient. The patient was provided an opportunity to ask questions and all were answered. The patient agreed with the plan and demonstrated an understanding of the instructions.   The patient was advised to call back or seek an in-person evaluation if the symptoms worsen or if the condition fails to improve as anticipated. Fu 1-2  or earlier if needed Time spent non face to face 33min or less  Merian Capron, MD 6/11/202110:07 AM

## 2019-12-04 ENCOUNTER — Other Ambulatory Visit (HOSPITAL_COMMUNITY): Payer: Self-pay | Admitting: Psychiatry

## 2020-01-02 ENCOUNTER — Encounter (HOSPITAL_COMMUNITY): Payer: Self-pay | Admitting: Psychiatry

## 2020-01-02 ENCOUNTER — Telehealth (INDEPENDENT_AMBULATORY_CARE_PROVIDER_SITE_OTHER): Payer: Commercial Managed Care - PPO | Admitting: Psychiatry

## 2020-01-02 DIAGNOSIS — F330 Major depressive disorder, recurrent, mild: Secondary | ICD-10-CM

## 2020-01-02 DIAGNOSIS — F411 Generalized anxiety disorder: Secondary | ICD-10-CM | POA: Diagnosis not present

## 2020-01-02 DIAGNOSIS — F410 Panic disorder [episodic paroxysmal anxiety] without agoraphobia: Secondary | ICD-10-CM

## 2020-01-02 MED ORDER — CITALOPRAM HYDROBROMIDE 20 MG PO TABS: 40.0000 mg | ORAL_TABLET | Freq: Every day | ORAL | 2 refills | Status: DC

## 2020-01-02 NOTE — Progress Notes (Signed)
[facility] Follow up visit  Patient Identification: Patty Castillo MRN:  [id number] Date of Evaluation:  01/02/2020 Referral Source: OBGYN Chief Complaint:    anxiety  Visit Diagnosis:    ICD-10-CM   1. GAD (generalized anxiety disorder)  F41.1   2. Panic anxiety syndrome  F41.0   3. MDD (major depressive disorder), recurrent episode, mild (HCC)  F33.0        I connected with Patty Castillo on 01/02/20 at 10:30 AM EDT by a video enabled telemedicine application and verified that I am speaking with the correct person using two identifiers.     I discussed the limitations of evaluation and management by telemedicine and the availability of in person appointments. The patient expressed understanding and agreed to proceed.  Patient location parked car Provider location home  History of Present Illness: Patient is a 35 years old currently married Caucasian female referred by OB/GYN for management of anxiety and depression  She has suffered anxiety  and panic attacks in the past starting when she was in college stressed out  They have relocated from [place] and celexa has been working  Doing fair on celexa now, not feeling jittery, did not have to use hydroxyzine  trazadone helps sleep uses prn   Family history of bipolar and depression No past psychiatric admission or suicide attempt  Aggravating factor: not known, college stress in the past Modifying factor: husband, kids  Duration 5 years plus   Past Psychiatric History: depression, anxiety  Previous Psychotropic Medications: No   Substance Abuse History in the last 12 months:  No.  Consequences of Substance Abuse: NA  Past Medical History:  Past Medical History:  Diagnosis Date  . Anxiety   . Depression   . Hx of varicella     Past Surgical History:  Procedure Laterality Date  . NO PAST SURGERIES      Family Psychiatric History: Uncle Bipolar, GM depression  Family History:  Family History   Problem Relation Age of Onset  . Cancer Mother        breast  . Hypothyroidism Mother   . Breast cancer Mother   . Hypertension Father   . Depression Sister   . Alzheimer's disease Maternal Grandmother   . Diabetes Maternal Grandfather   . Mental illness Paternal Grandmother   . Depression Paternal Grandmother   . Cancer Paternal Grandmother        breast  . Breast cancer Paternal Grandmother   . Cancer Paternal Grandfather   . Hypothyroidism Maternal Aunt     Social History:   Social History   Socioeconomic History  . Marital status: Married    Spouse name: Not on file  . Number of children: Not on file  . Years of education: Not on file  . Highest education level: Not on file  Occupational History  . Not on file  Tobacco Use  . Smoking status: Never Smoker  . Smokeless tobacco: Never Used  Substance and Sexual Activity  . Alcohol use: No  . Drug use: No  . Sexual activity: Yes    Birth control/protection: None  Other Topics Concern  . Not on file  Social History Narrative  . Not on file   Social Determinants of Health   Financial Resource Strain:   . Difficulty of Paying Living Expenses:   Food Insecurity:   . Worried About Programme researcher, broadcasting/film/video in the Last Year:   . [employer] of Food in the Last Year:  Transportation Needs:   . Freight forwarder (Medical):   Marland Kitchen Lack of Transportation (Non-Medical):   Physical Activity:   . Days of Exercise per Week:   . Minutes of Exercise per Session:   Stress:   . Feeling of Stress :   Social Connections:   . Frequency of Communication with Friends and Family:   . Frequency of Social Gatherings with Friends and Family:   . Attends Religious Services:   . Active Member of Clubs or Organizations:   . Attends Banker Meetings:   Marland Kitchen Marital Status:     Additional Social History: Growing was good in [place], no abuse   Allergies:   Allergies  Allergen Reactions  . Doxycycline Nausea And  Vomiting  . Latex Hives and Rash    Metabolic Disorder Labs: No results found for: HGBA1C, MPG No results found for: PROLACTIN No results found for: CHOL, TRIG, HDL, CHOLHDL, VLDL, LDLCALC No results found for: TSH  Therapeutic Level Labs: No results found for: LITHIUM No results found for: CBMZ No results found for: VALPROATE  Current Medications: Current Outpatient Medications  Medication Sig Dispense Refill  . citalopram (CELEXA) 20 MG tablet Take 2 tablets (40 mg total) by mouth at bedtime. 30 tablet 2  . hydrOXYzine (ATARAX/VISTARIL) 10 MG tablet Take 1 tablet (10 mg total) by mouth at bedtime as needed. 30 tablet 0  . ibuprofen (ADVIL,MOTRIN) 600 MG tablet Take 1 tablet (600 mg total) by mouth every 6 (six) hours. 30 tablet 0  . oxyCODONE-acetaminophen (PERCOCET/ROXICET) 5-325 MG tablet Take 1-2 tablets by mouth every 4 (four) hours as needed for severe pain. 15 tablet 0  . prenatal vitamin w/FE, FA (PRENATAL 1 + 1) 27-1 MG TABS tablet Take 1 tablet by mouth at bedtime.     . traZODone (DESYREL) 50 MG tablet TAKE ONE TABLET BY MOUTH EVERY NIGHT AT BEDTIME AS NEEDED FOR SLEEP 30 tablet 0   No current facility-administered medications for this visit.    Musculoskeletal:   Psychiatric Specialty Exam: Review of Systems  Cardiovascular: Negative for chest pain.  Skin: Negative for rash.  Psychiatric/Behavioral: Negative for depression, substance abuse and suicidal ideas.    currently breastfeeding.There is no height or weight on file to calculate BMI.  General Appearance: Casual  Eye Contact:  Fair  Speech:  Normal Rate  Volume:  Normal  Mood:fair  Affect:  Congruent  Thought Process:  Irrelevant  Orientation:  Full (Time, Place, and Person)  Thought Content:  Logical  Suicidal Thoughts:  No  Homicidal Thoughts:  No  Memory:  Immediate;   Fair Recent;   Good  Judgement:  Fair  Insight:  Fair  Psychomotor Activity:  Normal  Concentration:  Concentration: Fair and  Attention Span: Fair  Recall:  Good  Fund of Knowledge:Good  Language: Good  Akathisia:  No  Handed:  Right  AIMS (if indicated):  not done  Assets:  Financial Resources/Insurance Physical Health  ADL's:  Intact  Cognition: WNL  Sleep:  Fair   Screenings:   Assessment and Plan: as follows  MDD mild: doing fair, feels stable on celexa, continue .refills sent GAD with panic attacks: not jittery, anxiety manageable, continue celexa Consider therapy and distractions  Insomnia: reviewed sleep hygiene, takes trazadone prn , can continue  I discussed the assessment and treatment plan with the patient. The patient was provided an opportunity to ask questions and all were answered. The patient agreed with the plan and demonstrated an understanding  of the instructions.   The patient was advised to call back or seek an in-person evaluation if the symptoms worsen or if the condition fails to improve as anticipated. Fu  2-3 m. Time spent non face to face or less  Thresa Ross, MD 8/5/202110:34 AM

## 2020-02-19 ENCOUNTER — Other Ambulatory Visit (HOSPITAL_COMMUNITY): Payer: Self-pay | Admitting: Psychiatry

## 2020-04-02 ENCOUNTER — Other Ambulatory Visit (HOSPITAL_COMMUNITY): Payer: Self-pay | Admitting: Psychiatry

## 2020-04-06 ENCOUNTER — Telehealth (HOSPITAL_COMMUNITY): Payer: Commercial Managed Care - PPO | Admitting: Psychiatry

## 2020-04-07 ENCOUNTER — Encounter (HOSPITAL_COMMUNITY): Payer: Self-pay | Admitting: Psychiatry

## 2020-04-07 ENCOUNTER — Telehealth (INDEPENDENT_AMBULATORY_CARE_PROVIDER_SITE_OTHER): Payer: Commercial Managed Care - PPO | Admitting: Psychiatry

## 2020-04-07 DIAGNOSIS — F411 Generalized anxiety disorder: Secondary | ICD-10-CM

## 2020-04-07 DIAGNOSIS — F330 Major depressive disorder, recurrent, mild: Secondary | ICD-10-CM | POA: Diagnosis not present

## 2020-04-07 DIAGNOSIS — F410 Panic disorder [episodic paroxysmal anxiety] without agoraphobia: Secondary | ICD-10-CM | POA: Diagnosis not present

## 2020-04-07 MED ORDER — CITALOPRAM HYDROBROMIDE 20 MG PO TABS: ORAL_TABLET | ORAL | 3 refills | Status: DC

## 2020-04-07 NOTE — Progress Notes (Signed)
[facility] Follow up visit  Patient Identification: Patty Castillo MRN:  [id number] Date of Evaluation:  04/07/2020 Referral Source: OBGYN Chief Complaint:    anxiety  Follow up  Visit Diagnosis:    ICD-10-CM   1. GAD (generalized anxiety disorder)  F41.1   2. Panic anxiety syndrome  F41.0   3. MDD (major depressive disorder), recurrent episode, mild (HCC)  F33.0          I connected with Patty Castillo on 04/07/20 at  3:00 PM EST by a video enabled telemedicine application and verified that I am speaking with the correct person using two identifiers.    I discussed the limitations of evaluation and management by telemedicine and the availability of in person appointments. The patient expressed understanding and agreed to proceed.  Patient location : home Provider location home office  History of Present Illness: Patient is a 34 years old currently married Caucasian female referred by OB/GYN for management of anxiety and depression  Brief history " She has suffered anxiety  and panic attacks in the past ,  when she was in college stressed out They have relocated from [place] and celexa has been working"   Doing fair on celexa, seldom takes vistaril , takes trazadone prn for sleep Taking care of kids and busy Mood is balanced   Aggravating factor: not known, college stress in the past Modifying factor: husband, kids  Duration 5 years plus   Past Psychiatric History: depression, anxiety  Previous Psychotropic Medications: No   Substance Abuse History in the last 12 months:  No.  Consequences of Substance Abuse: NA  Past Medical History:  Past Medical History:  Diagnosis Date  . Anxiety   . Depression   . Hx of varicella     Past Surgical History:  Procedure Laterality Date  . NO PAST SURGERIES      Family Psychiatric History: Uncle Bipolar, GM depression  Family History:  Family History  Problem Relation Age of Onset  . Cancer Mother         breast  . Hypothyroidism Mother   . Breast cancer Mother   . Hypertension Father   . Depression Sister   . Alzheimer's disease Maternal Grandmother   . Diabetes Maternal Grandfather   . Mental illness Paternal Grandmother   . Depression Paternal Grandmother   . Cancer Paternal Grandmother        breast  . Breast cancer Paternal Grandmother   . Cancer Paternal Grandfather   . Hypothyroidism Maternal Aunt     Social History:   Social History   Socioeconomic History  . Marital status: Married    Spouse name: Not on file  . Number of children: Not on file  . Years of education: Not on file  . Highest education level: Not on file  Occupational History  . Not on file  Tobacco Use  . Smoking status: Never Smoker  . Smokeless tobacco: Never Used  Substance and Sexual Activity  . Alcohol use: No  . Drug use: No  . Sexual activity: Yes    Birth control/protection: None  Other Topics Concern  . Not on file  Social History Narrative  . Not on file   Social Determinants of Health   Financial Resource Strain:   . Difficulty of Paying Living Expenses: Not on file  Food Insecurity:   . Worried About Programme researcher, broadcasting/film/video in the Last Year: Not on file  . Ran Out of Food in the Last  Year: Not on file  Transportation Needs:   . Lack of Transportation (Medical): Not on file  . Lack of Transportation (Non-Medical): Not on file  Physical Activity:   . Days of Exercise per Week: Not on file  . Minutes of Exercise per Session: Not on file  Stress:   . Feeling of Stress : Not on file  Social Connections:   . Frequency of Communication with Friends and Family: Not on file  . Frequency of Social Gatherings with Friends and Family: Not on file  . Attends Religious Services: Not on file  . Active Member of Clubs or Organizations: Not on file  . Attends Banker Meetings: Not on file  . Marital Status: Not on file    Additional Social History: [place], no abuse   Allergies:   Allergies  Allergen Reactions  . Doxycycline Nausea And Vomiting  . Latex Hives and Rash    Metabolic Disorder Labs: No results found for: HGBA1C, MPG No results found for: PROLACTIN No results found for: CHOL, TRIG, HDL, CHOLHDL, VLDL, LDLCALC No results found for: TSH  Therapeutic Level Labs: No results found for: LITHIUM No results found for: CBMZ No results found for: VALPROATE  Current Medications: Current Outpatient Medications  Medication Sig Dispense Refill  . citalopram (CELEXA) 20 MG tablet TAKE TWO TABLETS BY MOUTH EVERY NIGHT AT BEDTIME 60 tablet 3  . hydrOXYzine (ATARAX/VISTARIL) 10 MG tablet Take 1 tablet (10 mg total) by mouth at bedtime as needed. 30 tablet 0  . ibuprofen (ADVIL,MOTRIN) 600 MG tablet Take 1 tablet (600 mg total) by mouth every 6 (six) hours. 30 tablet 0  . oxyCODONE-acetaminophen (PERCOCET/ROXICET) 5-325 MG tablet Take 1-2 tablets by mouth every 4 (four) hours as needed for severe pain. 15 tablet 0  . prenatal vitamin w/FE, FA (PRENATAL 1 + 1) 27-1 MG TABS tablet Take 1 tablet by mouth at bedtime.     . traZODone (DESYREL) 50 MG tablet TAKE ONE TABLET BY MOUTH EVERY NIGHT AT BEDTIME AS NEEDED FOR SLEEP 30 tablet 0   No current facility-administered medications for this visit.    Musculoskeletal:   Psychiatric Specialty Exam: Review of Systems  Cardiovascular: Negative for chest pain.  Skin: Negative for rash.  Psychiatric/Behavioral: Negative for depression, substance abuse and suicidal ideas.    currently breastfeeding.There is no height or weight on file to calculate BMI.  General Appearance: Casual  Eye Contact:  Fair  Speech:  Normal Rate  Volume:  Normal  Mood: fair  Affect:  Congruent  Thought Process:  Irrelevant  Orientation:  Full (Time, Place, and Person)  Thought Content:  Logical  Suicidal Thoughts:  No  Homicidal Thoughts:  No  Memory:  Immediate;   Fair Recent;   Good   Judgement:  Fair  Insight:  Fair  Psychomotor Activity:  Normal  Concentration:  Concentration: Fair and Attention Span: Fair  Recall:  Good  Fund of Knowledge:Good  Language: Good  Akathisia:  No  Handed:  Right  AIMS (if indicated):  not done  Assets:  Financial Resources/Insurance Physical Health  ADL's:  Intact  Cognition: WNL  Sleep:  Fair   Screenings:   Assessment and Plan: as follows  MDD mild: fair, continue celexa GAD with panic attacks: doing good, manageable continue celexa Consider therapy and distractions if needed  Insomnia: fair takes trazadone prn , can continue  I discussed the assessment and treatment plan with the patient. The patient was  provided an opportunity to ask questions and all were answered. The patient agreed with the plan and demonstrated an understanding of the instructions.   The patient was advised to call back or seek an in-person evaluation if the symptoms worsen or if the condition fails to improve as anticipated. Fu  4 -5 m or earlier if needed  Thresa Ross, MD 11/9/20213:11 PM

## 2020-04-16 ENCOUNTER — Other Ambulatory Visit (HOSPITAL_COMMUNITY): Payer: Self-pay | Admitting: Psychiatry

## 2020-07-27 ENCOUNTER — Telehealth (HOSPITAL_COMMUNITY): Payer: Self-pay | Admitting: *Deleted

## 2020-07-27 MED ORDER — CITALOPRAM HYDROBROMIDE 20 MG PO TABS: ORAL_TABLET | ORAL | 0 refills | Status: DC

## 2020-07-27 NOTE — Telephone Encounter (Signed)
Thanks

## 2020-07-27 NOTE — Telephone Encounter (Signed)
sent 

## 2020-07-27 NOTE — Telephone Encounter (Signed)
Patient needs a refill of Celexa.  Her next appt is 08/20/20

## 2020-08-20 ENCOUNTER — Telehealth (INDEPENDENT_AMBULATORY_CARE_PROVIDER_SITE_OTHER): Payer: Commercial Managed Care - PPO | Admitting: Psychiatry

## 2020-08-20 ENCOUNTER — Encounter (HOSPITAL_COMMUNITY): Payer: Self-pay | Admitting: Psychiatry

## 2020-08-20 DIAGNOSIS — F410 Panic disorder [episodic paroxysmal anxiety] without agoraphobia: Secondary | ICD-10-CM | POA: Diagnosis not present

## 2020-08-20 DIAGNOSIS — F411 Generalized anxiety disorder: Secondary | ICD-10-CM

## 2020-08-20 MED ORDER — CITALOPRAM HYDROBROMIDE 20 MG PO TABS: ORAL_TABLET | ORAL | 1 refills | Status: DC

## 2020-08-20 NOTE — Progress Notes (Signed)
[facility] Follow up visit  Patient Identification: Patty Castillo MRN:  [id number] Date of Evaluation:  08/20/2020 Referral Source: OBGYN Chief Complaint:    anxiety  Follow up  Visit Diagnosis:    ICD-10-CM   1. GAD (generalized anxiety disorder)  F41.1   2. Panic anxiety syndrome  F41.0       Virtual Visit via Video Note  I connected with Patty Castillo on 08/20/20 at  3:00 PM EDT by a video enabled telemedicine application and verified that I am speaking with the correct person using two identifiers.  Location: Patient: home Provider: office   I discussed the limitations of evaluation and management by telemedicine and the availability of in person appointments. The patient expressed understanding and agreed to proceed.    I discussed the assessment and treatment plan with the patient. The patient was provided an opportunity to ask questions and all were answered. The patient agreed with the plan and demonstrated an understanding of the instructions.   The patient was advised to call back or seek an in-person evaluation if the symptoms worsen or if the condition fails to improve as anticipated.  I provided 10  minutes of non-face-to-face time during this encounter.    History of Present Illness: Patient is a 35 years old currently married Caucasian female referred initially by OB/GYN for management of anxiety and depression   She is doing fair now celexa is 30mg  instead of 40mg . Tolerating it  Panic attacks and anxiety is manageable  Takes care of 2 kids at home Has relocated from [place]    Aggravating factor: not known, college stress in the past Modifying factor: husband, kids  Duration 5 years plus   Past Psychiatric History: depression, anxiety  Previous Psychotropic Medications: No   Substance Abuse History in the last 12 months:  No.  Consequences of Substance Abuse: NA  Past Medical History:  Past Medical History:  Diagnosis Date  .  Anxiety   . Depression   . Hx of varicella     Past Surgical History:  Procedure Laterality Date  . NO PAST SURGERIES      Family Psychiatric History: Uncle Bipolar, GM depression  Family History:  Family History  Problem Relation Age of Onset  . Cancer Mother        breast  . Hypothyroidism Mother   . Breast cancer Mother   . Hypertension Father   . Depression Sister   . Alzheimer's disease Maternal Grandmother   . Diabetes Maternal Grandfather   . Mental illness Paternal Grandmother   . Depression Paternal Grandmother   . Cancer Paternal Grandmother        breast  . Breast cancer Paternal Grandmother   . Cancer Paternal Grandfather   . Hypothyroidism Maternal Aunt     Social History:   Social History   Socioeconomic History  . Marital status: Married    Spouse name: Not on file  . Number of children: Not on file  . Years of education: Not on file  . Highest education level: Not on file  Occupational History  . Not on file  Tobacco Use  . Smoking status: Never Smoker  . Smokeless tobacco: Never Used  Substance and Sexual Activity  . Alcohol use: No  . Drug use: No  . Sexual activity: Yes    Birth control/protection: None  Other Topics Concern  . Not on file  Social History Narrative  . Not on file   Social Determinants of Health  Financial Resource Strain: Not on file  Food Insecurity: Not on file  Transportation Needs: Not on file  Physical Activity: Not on file  Stress: Not on file  Social Connections: Not on file     Allergies:   Allergies  Allergen Reactions  . Doxycycline Nausea And Vomiting  . Latex Hives and Rash    Metabolic Disorder Labs: No results found for: HGBA1C, MPG No results found for: PROLACTIN No results found for: CHOL, TRIG, HDL, CHOLHDL, VLDL, LDLCALC No results found for: TSH  Therapeutic Level Labs: No results found for: LITHIUM No results found for: CBMZ No results found for: VALPROATE  Current  Medications: Current Outpatient Medications  Medication Sig Dispense Refill  . citalopram (CELEXA) 20 MG tablet TAKE TWO TABLETS BY MOUTH EVERY NIGHT AT BEDTIME 60 tablet 1  . hydrOXYzine (ATARAX/VISTARIL) 10 MG tablet Take 1 tablet (10 mg total) by mouth at bedtime as needed. 30 tablet 0  . ibuprofen (ADVIL,MOTRIN) 600 MG tablet Take 1 tablet (600 mg total) by mouth every 6 (six) hours. 30 tablet 0  . oxyCODONE-acetaminophen (PERCOCET/ROXICET) 5-325 MG tablet Take 1-2 tablets by mouth every 4 (four) hours as needed for severe pain. 15 tablet 0  . prenatal vitamin w/FE, FA (PRENATAL 1 + 1) 27-1 MG TABS tablet Take 1 tablet by mouth at bedtime.     . traZODone (DESYREL) 50 MG tablet TAKE ONE TABLET BY MOUTH EVERY NIGHT AT BEDTIME AS NEEDED FOR SLEEP 30 tablet 0   No current facility-administered medications for this visit.    Musculoskeletal:   Psychiatric Specialty Exam: Review of Systems  Cardiovascular: Negative for chest pain.  Psychiatric/Behavioral: Negative for depression, substance abuse and suicidal ideas.    currently breastfeeding.There is no height or weight on file to calculate BMI.  General Appearance: Casual  Eye Contact:  Fair  Speech:  Normal Rate  Volume:  Normal  Mood: euthymic  Affect:  Congruent  Thought Process:  Irrelevant  Orientation:  Full (Time, Place, and Person)  Thought Content:  Logical  Suicidal Thoughts:  No  Homicidal Thoughts:  No  Memory:  Immediate;   Fair Recent;   Good  Judgement:  Fair  Insight:  Fair  Psychomotor Activity:  Normal  Concentration:  Concentration: Fair and Attention Span: Fair  Recall:  Good  Fund of Knowledge:Good  Language: Good  Akathisia:  No  Handed:  Right  AIMS (if indicated):  not done  Assets:  Financial Resources/Insurance Physical Health  ADL's:  Intact  Cognition: WNL  Sleep:  Fair   Screenings: Flowsheet Row Video Visit from 08/20/2020 in [facility] AT [place]  C-SSRS  RISK CATEGORY No Risk      Assessment and Plan: as follows  MDD mild: stable continue celexa  GAD with panic attacks:  Feels stable continue celexa now at 30mg , got prescription for 20mg  tablets upto bid, but using as 30mg  per day  Insomnia: fair and seldom takes trazadone   Fu 25m.   , MD 3/24/20223:14 PM

## 2020-11-02 ENCOUNTER — Other Ambulatory Visit (HOSPITAL_COMMUNITY): Payer: Self-pay

## 2020-11-02 MED ORDER — CITALOPRAM HYDROBROMIDE 20 MG PO TABS: ORAL_TABLET | ORAL | 1 refills | Status: DC

## 2020-12-07 ENCOUNTER — Telehealth (INDEPENDENT_AMBULATORY_CARE_PROVIDER_SITE_OTHER): Payer: Commercial Managed Care - PPO | Admitting: Psychiatry

## 2020-12-07 ENCOUNTER — Encounter (HOSPITAL_COMMUNITY): Payer: Self-pay | Admitting: Psychiatry

## 2020-12-07 DIAGNOSIS — F411 Generalized anxiety disorder: Secondary | ICD-10-CM | POA: Diagnosis not present

## 2020-12-07 DIAGNOSIS — F410 Panic disorder [episodic paroxysmal anxiety] without agoraphobia: Secondary | ICD-10-CM

## 2020-12-07 DIAGNOSIS — F330 Major depressive disorder, recurrent, mild: Secondary | ICD-10-CM

## 2020-12-07 NOTE — Progress Notes (Signed)
[facility] Follow up visit  Patient Identification: Patty Castillo MRN:  [id number] Date of Evaluation:  12/07/2020 Referral Source: OBGYN Chief Complaint:    anxiety  Follow up  Visit Diagnosis:    ICD-10-CM   1. GAD (generalized anxiety disorder)  F41.1     2. Panic anxiety syndrome  F41.0     3. MDD (major depressive disorder), recurrent episode, mild (HCC)  F33.0      Virtual Visit via Video Note  I connected with [name] on 12/07/20 at  1:30 PM EDT by a video enabled telemedicine application and verified that I am speaking with the correct person using two identifiers.  Location: Patient: home Provider: home office   I discussed the limitations of evaluation and management by telemedicine and the availability of in person appointments. The patient expressed understanding and agreed to proceed.      I discussed the assessment and treatment plan with the patient. The patient was provided an opportunity to ask questions and all were answered. The patient agreed with the plan and demonstrated an understanding of the instructions.   The patient was advised to call back or seek an in-person evaluation if the symptoms worsen or if the condition fails to improve as anticipated.  I provided 10  minutes of non-face-to-face time during this encounter.    History of Present Illness: Patient is a 35 years old currently married Caucasian female referred initially by OB/GYN for management of anxiety and depression  Doing fair on celexa 30mg  not on 40mg  Panic and anxiety manageable Worries related to her daughter kindergarten choice   Has relocated from [place]    Aggravating factor: not known, college stress in the past Modifying factor: husband, kids   Duration 5 years plus   Past Psychiatric History: depression, anxiety  Previous Psychotropic Medications: No   Substance Abuse History in the last 12 months:  No.  Consequences of Substance  Abuse: NA  Past Medical History:  Past Medical History:  Diagnosis Date   Anxiety    Depression    Hx of varicella     Past Surgical History:  Procedure Laterality Date   NO PAST SURGERIES      Family Psychiatric History: Uncle Bipolar, GM depression  Family History:  Family History  Problem Relation Age of Onset   Cancer Mother        breast   Hypothyroidism Mother    Breast cancer Mother    Hypertension Father    Depression Sister    Alzheimer's disease Maternal Grandmother    Diabetes Maternal Grandfather    Mental illness Paternal Grandmother    Depression Paternal Grandmother    Cancer Paternal Grandmother        breast   Breast cancer Paternal Grandmother    Cancer Paternal Grandfather    Hypothyroidism Maternal Aunt     Social History:   Social History   Socioeconomic History   Marital status: Married    Spouse name: Not on file   Number of children: Not on file   Years of education: Not on file   Highest education level: Not on file  Occupational History   Not on file  Tobacco Use   Smoking status: Never   Smokeless tobacco: Never  Substance and Sexual Activity   Alcohol use: No   Drug use: No   Sexual activity: Yes    Birth control/protection: None  Other Topics Concern   Not on file  Social History Narrative  Not on file   Social Determinants of Health   Financial Resource Strain: Not on file  Food Insecurity: Not on file  Transportation Needs: Not on file  Physical Activity: Not on file  Stress: Not on file  Social Connections: Not on file     Allergies:   Allergies  Allergen Reactions   Doxycycline Nausea And Vomiting   Latex Hives and Rash    Metabolic Disorder Labs: No results found for: HGBA1C, MPG No results found for: PROLACTIN No results found for: CHOL, TRIG, HDL, CHOLHDL, VLDL, LDLCALC No results found for: TSH  Therapeutic Level Labs: No results found for: LITHIUM No results found for: CBMZ No results found  for: VALPROATE  Current Medications: Current Outpatient Medications  Medication Sig Dispense Refill   citalopram (CELEXA) 20 MG tablet TAKE TWO TABLETS BY MOUTH EVERY NIGHT AT BEDTIME 60 tablet 1   ibuprofen (ADVIL,MOTRIN) 600 MG tablet Take 1 tablet (600 mg total) by mouth every 6 (six) hours. 30 tablet 0   prenatal vitamin w/FE, FA (PRENATAL 1 + 1) 27-1 MG TABS tablet Take 1 tablet by mouth at bedtime.      No current facility-administered medications for this visit.    Musculoskeletal:   Psychiatric Specialty Exam: Review of Systems  Cardiovascular:  Negative for chest pain.  Psychiatric/Behavioral:  Negative for depression, substance abuse and suicidal ideas.    currently breastfeeding.There is no height or weight on file to calculate BMI.  General Appearance: Casual  Eye Contact:  Fair  Speech:  Normal Rate  Volume:  Normal  Mood: euthymic  Affect:  Congruent  Thought Process:  Irrelevant  Orientation:  Full (Time, Place, and Person)  Thought Content:  Logical  Suicidal Thoughts:  No  Homicidal Thoughts:  No  Memory:  Immediate;   Fair Recent;   Good  Judgement:  Fair  Insight:  Fair  Psychomotor Activity:  Normal  Concentration:  Concentration: Fair and Attention Span: Fair  Recall:  Good  Fund of Knowledge:Good  Language: Good  Akathisia:  No  Handed:  Right  AIMS (if indicated):  not done  Assets:  Financial Resources/Insurance Physical Health  ADL's:  Intact  Cognition: WNL  Sleep:  Fair   Screenings: Flowsheet Row Video Visit from 12/07/2020 in [facility] AT [place] Video Visit from 08/20/2020 in [facility] AT [place]  C-SSRS RISK CATEGORY No Risk No Risk       Assessment and Plan: as follows Prior documentation reviewed   MDD mild: remains stable, takes celexa 30mg ,   GAD with panic attacks:  Feels stable continue celexa now at 30mg , got prescription for 20mg  tablets upto bid, but using  as 30mg  per day  Insomnia: handling fair without trazadone  Fu 33m.   , MD 7/11/20221:37 PM

## 2021-01-30 ENCOUNTER — Other Ambulatory Visit (HOSPITAL_COMMUNITY): Payer: Self-pay | Admitting: Psychiatry

## 2021-05-27 ENCOUNTER — Other Ambulatory Visit (HOSPITAL_COMMUNITY): Payer: Self-pay | Admitting: Psychiatry

## 2021-06-14 ENCOUNTER — Telehealth (INDEPENDENT_AMBULATORY_CARE_PROVIDER_SITE_OTHER): Payer: Commercial Managed Care - PPO | Admitting: Psychiatry

## 2021-06-14 ENCOUNTER — Encounter (HOSPITAL_COMMUNITY): Payer: Self-pay | Admitting: Psychiatry

## 2021-06-14 DIAGNOSIS — F411 Generalized anxiety disorder: Secondary | ICD-10-CM

## 2021-06-14 DIAGNOSIS — F330 Major depressive disorder, recurrent, mild: Secondary | ICD-10-CM | POA: Diagnosis not present

## 2021-06-14 DIAGNOSIS — F410 Panic disorder [episodic paroxysmal anxiety] without agoraphobia: Secondary | ICD-10-CM

## 2021-06-14 MED ORDER — CITALOPRAM HYDROBROMIDE 10 MG PO TABS: ORAL_TABLET | ORAL | 0 refills | Status: DC

## 2021-06-14 NOTE — Progress Notes (Signed)
[facility] Follow up visit  Patient Identification: Patty Castillo MRN:  [id number] Date of Evaluation:  06/14/2021 Referral Source: OBGYN Chief Complaint:    anxiety  Follow up  Visit Diagnosis:    ICD-10-CM   1. GAD (generalized anxiety disorder)  F41.1     2. Panic anxiety syndrome  F41.0     3. MDD (major depressive disorder), recurrent episode, mild (HCC)  F33.0     Virtual Visit via Video Note  I connected with Patty Castillo on 06/14/21 at  1:30 PM EST by a video enabled telemedicine application and verified that I am speaking with the correct person using two identifiers.  Location: Patient: home Provider: home office   I discussed the limitations of evaluation and management by telemedicine and the availability of in person appointments. The patient expressed understanding and agreed to proceed.     I discussed the assessment and treatment plan with the patient. The patient was provided an opportunity to ask questions and all were answered. The patient agreed with the plan and demonstrated an understanding of the instructions.   The patient was advised to call back or seek an in-person evaluation if the symptoms worsen or if the condition fails to improve as anticipated.  I provided 15 minutes of non-face-to-face time during this encounter.   History of Present Illness: Patient is a 36 years old currently married Caucasian female referred initially by OB/GYN for management of anxiety and depression  Have been lowering dose of celexa, was on 20mg  but reduced herself to 10mg , doing good, feels not anxious or subuded Stress related to daughter 65 years of age but now home schooling her and she is not anxious  Has relocated from [place]    Aggravating factor: daughter stress but better, college stress in the past Modifying factor: husband,  kids   Duration 5 years plus   Past Psychiatric History: depression, anxiety  Previous Psychotropic Medications:  No   Substance Abuse History in the last 12 months:  No.  Consequences of Substance Abuse: NA  Past Medical History:  Past Medical History:  Diagnosis Date   Anxiety    Depression    Hx of varicella     Past Surgical History:  Procedure Laterality Date   NO PAST SURGERIES      Family Psychiatric History: Uncle Bipolar, GM depression  Family History:  Family History  Problem Relation Age of Onset   Cancer Mother        breast   Hypothyroidism Mother    Breast cancer Mother    Hypertension Father    Depression Sister    Alzheimer's disease Maternal Grandmother    Diabetes Maternal Grandfather    Mental illness Paternal Grandmother    Depression Paternal Grandmother    Cancer Paternal Grandmother        breast   Breast cancer Paternal Grandmother    Cancer Paternal Grandfather    Hypothyroidism Maternal Aunt     Social History:   Social History   Socioeconomic History   Marital status: Married    Spouse name: Not on file   Number of children: Not on file   Years of education: Not on file   Highest education level: Not on file  Occupational History   Not on file  Tobacco Use   Smoking status: Never   Smokeless tobacco: Never  Substance and Sexual Activity   Alcohol use: No   Drug use: No   Sexual activity: Yes  Birth control/protection: None  Other Topics Concern   Not on file  Social History Narrative   Not on file   Social Determinants of Health   Financial Resource Strain: Not on file  Food Insecurity: Not on file  Transportation Needs: Not on file  Physical Activity: Not on file  Stress: Not on file  Social Connections: Not on file     Allergies:   Allergies  Allergen Reactions   Doxycycline Nausea And Vomiting   Latex Hives and Rash    Metabolic Disorder Labs: No results found for: HGBA1C, MPG No results found for: PROLACTIN No results found for: CHOL, TRIG, HDL, CHOLHDL, VLDL, LDLCALC No results found for: TSH  Therapeutic  Level Labs: No results found for: LITHIUM No results found for: CBMZ No results found for: VALPROATE  Current Medications: Current Outpatient Medications  Medication Sig Dispense Refill   citalopram (CELEXA) 10 MG tablet Take one at evening 30 tablet 0   ibuprofen (ADVIL,MOTRIN) 600 MG tablet Take 1 tablet (600 mg total) by mouth every 6 (six) hours. 30 tablet 0   prenatal vitamin w/FE, FA (PRENATAL 1 + 1) 27-1 MG TABS tablet Take 1 tablet by mouth at bedtime.      No current facility-administered medications for this visit.    Musculoskeletal:   Psychiatric Specialty Exam: Review of Systems  Cardiovascular:  Negative for chest pain.  Gastrointestinal:  Negative for nausea.  Psychiatric/Behavioral:  Negative for depression, substance abuse and suicidal ideas.    currently breastfeeding.There is no height or weight on file to calculate BMI.  General Appearance: Casual  Eye Contact:  Fair  Speech:  Normal Rate  Volume:  Normal  Mood: euthymic  Affect:  Congruent  Thought Process:  Irrelevant  Orientation:  Full (Time, Place, and Person)  Thought Content:  Logical  Suicidal Thoughts:  No  Homicidal Thoughts:  No  Memory:  Immediate;   Fair Recent;   Good  Judgement:  Fair  Insight:  Fair  Psychomotor Activity:  Normal  Concentration:  Concentration: Fair and Attention Span: Fair  Recall:  Good  Fund of Knowledge:Good  Language: Good  Akathisia:  No  Handed:  Right  AIMS (if indicated):  not done  Assets:  Financial Resources/Insurance Physical Health  ADL's:  Intact  Cognition: WNL  Sleep:  Fair   Screenings: Flowsheet Row Video Visit from 06/14/2021 in [facility] AT [place] Video Visit from 12/07/2020 in [facility] AT [place] Video Visit from 08/20/2020 in [facility] AT [place]  C-SSRS RISK CATEGORY No Risk No Risk No Risk       Assessment and Plan: as follows  Prior  documentation reviewed   MDD mild: stable continue celexa 10mg   GAD with panic attacks: doing good on 10mg , continue  Insomnia: doing good on trazadone, can continue  , MD 1/16/20231:43 PM

## 2021-07-05 ENCOUNTER — Telehealth (HOSPITAL_COMMUNITY): Payer: Self-pay

## 2021-07-05 DIAGNOSIS — F411 Generalized anxiety disorder: Secondary | ICD-10-CM

## 2021-07-05 DIAGNOSIS — F5102 Adjustment insomnia: Secondary | ICD-10-CM

## 2021-07-05 MED ORDER — CITALOPRAM HYDROBROMIDE 20 MG PO TABS: 20.0000 mg | ORAL_TABLET | Freq: Every day | ORAL | 0 refills | Status: DC

## 2021-07-05 MED ORDER — TRAZODONE HCL 50 MG PO TABS: ORAL_TABLET | ORAL | 0 refills | Status: DC

## 2021-07-05 NOTE — Telephone Encounter (Signed)
Medication management - Called pt to inform Dr. Gilmore Laroche was okay with [place] sending in a new Trazodone 50 mg order and Celexa 10 mg, 1 and 1/2 a day.  Pt. stated she had already gone up to 20 mg, 2 of the 10 mgs and was doing better with this dosage. Agreed to send Dr. Gilmore Laroche another message to see if patient could continue Celexa at 20 mg, what she had taken herself up to and reports is helping her anxiety more.

## 2021-07-05 NOTE — Telephone Encounter (Signed)
Medication management - After consulting with Dr. Gilmore Laroche, e-scribd in a new Trazodone 50 mg, one at bedtime as needed and new Citalopram 20 mg, one in the evening orders, #30 with no refills per Dr. Gilmore Laroche.  Left pt a message these were sent to her [facility] as requested.  Patient to call back if any issues.

## 2021-07-05 NOTE — Telephone Encounter (Signed)
Medication management - Telephone call with patient, after she left a message, she has been more anxious as of late with increased problems sleeping.  Pt requests a new Trazodone order and an increase in her Celexa to see if this would help with her increased anxiety and current insomnia.  Patient requested these be sent into her [facility] on [address], [place].  Patient denied any suicidal or homicidal ideations, no plan or intent and stated she is more anxious than depressed.

## 2021-07-17 ENCOUNTER — Other Ambulatory Visit (HOSPITAL_COMMUNITY): Payer: Self-pay | Admitting: Psychiatry

## 2021-07-31 ENCOUNTER — Other Ambulatory Visit (HOSPITAL_COMMUNITY): Payer: Self-pay | Admitting: Psychiatry

## 2021-07-31 DIAGNOSIS — F411 Generalized anxiety disorder: Secondary | ICD-10-CM

## 2021-07-31 DIAGNOSIS — F5102 Adjustment insomnia: Secondary | ICD-10-CM

## 2021-08-29 ENCOUNTER — Other Ambulatory Visit (HOSPITAL_COMMUNITY): Payer: Self-pay | Admitting: Psychiatry

## 2021-08-29 DIAGNOSIS — F5102 Adjustment insomnia: Secondary | ICD-10-CM

## 2021-08-29 DIAGNOSIS — F411 Generalized anxiety disorder: Secondary | ICD-10-CM

## 2021-09-23 ENCOUNTER — Ambulatory Visit: Payer: Commercial Managed Care - PPO | Admitting: Physician Assistant

## 2021-09-25 ENCOUNTER — Other Ambulatory Visit (HOSPITAL_COMMUNITY): Payer: Self-pay | Admitting: Psychiatry

## 2021-09-25 DIAGNOSIS — F411 Generalized anxiety disorder: Secondary | ICD-10-CM

## 2021-09-25 DIAGNOSIS — F5102 Adjustment insomnia: Secondary | ICD-10-CM

## 2021-11-01 ENCOUNTER — Other Ambulatory Visit (HOSPITAL_COMMUNITY): Payer: Self-pay | Admitting: Psychiatry

## 2021-11-01 DIAGNOSIS — F411 Generalized anxiety disorder: Secondary | ICD-10-CM

## 2021-11-01 NOTE — Telephone Encounter (Signed)
Medication refill - one time Celexa 20 mg order provided per approval by Dr. Gilmore Laroche until patient seen 11/08/21.

## 2021-11-08 ENCOUNTER — Telehealth (HOSPITAL_COMMUNITY): Payer: Commercial Managed Care - PPO | Admitting: Psychiatry

## 2021-11-17 ENCOUNTER — Telehealth (INDEPENDENT_AMBULATORY_CARE_PROVIDER_SITE_OTHER): Payer: Commercial Managed Care - PPO | Admitting: Psychiatry

## 2021-11-17 ENCOUNTER — Encounter (HOSPITAL_COMMUNITY): Payer: Self-pay | Admitting: Psychiatry

## 2021-11-17 DIAGNOSIS — F411 Generalized anxiety disorder: Secondary | ICD-10-CM | POA: Diagnosis not present

## 2021-11-17 DIAGNOSIS — F5102 Adjustment insomnia: Secondary | ICD-10-CM | POA: Diagnosis not present

## 2021-11-17 DIAGNOSIS — F330 Major depressive disorder, recurrent, mild: Secondary | ICD-10-CM | POA: Diagnosis not present

## 2021-11-17 DIAGNOSIS — F410 Panic disorder [episodic paroxysmal anxiety] without agoraphobia: Secondary | ICD-10-CM | POA: Diagnosis not present

## 2021-11-17 MED ORDER — CITALOPRAM HYDROBROMIDE 20 MG PO TABS: 20.0000 mg | ORAL_TABLET | Freq: Every evening | ORAL | 3 refills | Status: DC

## 2021-11-17 NOTE — Progress Notes (Signed)
[facility] Follow up visit  Patient Identification: Patty Castillo MRN:  [id number] Date of Evaluation:  11/17/2021 Referral Source: OBGYN Chief Complaint:    anxiety  Follow up  Visit Diagnosis:    ICD-10-CM   1. GAD (generalized anxiety disorder)  F41.1 citalopram (CELEXA) 20 MG tablet    2. Adjustment insomnia  F51.02     3. Panic anxiety syndrome  F41.0     4. MDD (major depressive disorder), recurrent episode, mild (HCC)  F33.0     Virtual Visit via Video Note  I connected with Patty Castillo on 11/17/21 at  2:00 PM EDT by a video enabled telemedicine application and verified that I am speaking with the correct person using two identifiers.  Location: Patient: home Provider: home office   I discussed the limitations of evaluation and management by telemedicine and the availability of in person appointments. The patient expressed understanding and agreed to proceed.     I discussed the assessment and treatment plan with the patient. The patient was provided an opportunity to ask questions and all were answered. The patient agreed with the plan and demonstrated an understanding of the instructions.   The patient was advised to call back or seek an in-person evaluation if the symptoms worsen or if the condition fails to improve as anticipated.  I provided 15 minutes of non-face-to-face time during this encounter including chart review and documentation. Coded with complexity  History of Present Illness: Patient is a 36 years old currently married Caucasian female referred initially by OB/GYN for management of anxiety and depression  Has tried to lower celexa to 10mg  it didn't work, back on 20mg , helps depression, anxiety and panic attacks   Has relocated from  Daughter 5 years was giving difficult time in school,now home schooling   Aggravating factor: daughter stress but better now, college stress in the past Modifying factor: husband,   kids   Duration 6 years  Severity manageable   Past Psychiatric History: depression, anxiety  Previous Psychotropic Medications: No   Substance Abuse History in the last 12 months:  No.  Consequences of Substance Abuse: NA  Past Medical History:  Past Medical History:  Diagnosis Date   Anxiety    Depression    Hx of varicella     Past Surgical History:  Procedure Laterality Date   NO PAST SURGERIES      Family Psychiatric History: Uncle Bipolar, GM depression  Family History:  Family History  Problem Relation Age of Onset   Cancer Mother        breast   Hypothyroidism Mother    Breast cancer Mother    Hypertension Father    Depression Sister    Alzheimer's disease Maternal Grandmother    Diabetes Maternal Grandfather    Mental illness Paternal Grandmother    Depression Paternal Grandmother    Cancer Paternal Grandmother        breast   Breast cancer Paternal Grandmother    Cancer Paternal Grandfather    Hypothyroidism Maternal Aunt     Social History:   Social History   Socioeconomic History   Marital status: Married    Spouse name: Not on file   Number of children: Not on file   Years of education: Not on file   Highest education level: Not on file  Occupational History   Not on file  Tobacco Use   Smoking status: Never   Smokeless tobacco: Never  Substance and Sexual Activity  Alcohol use: No   Drug use: No   Sexual activity: Yes    Birth control/protection: None  Other Topics Concern   Not on file  Social History Narrative   Not on file   Social Determinants of Health   Financial Resource Strain: Not on file  Food Insecurity: Not on file  Transportation Needs: Not on file  Physical Activity: Not on file  Stress: Not on file  Social Connections: Not on file     Allergies:   Allergies  Allergen Reactions   Doxycycline Nausea And Vomiting   Latex Hives and Rash    Metabolic Disorder Labs: No results found for: "HGBA1C",  "MPG" No results found for: "PROLACTIN" No results found for: "CHOL", "TRIG", "HDL", "CHOLHDL", "VLDL", "LDLCALC" No results found for: "TSH"  Therapeutic Level Labs: No results found for: "LITHIUM" No results found for: "CBMZ" No results found for: "VALPROATE"  Current Medications: Current Outpatient Medications  Medication Sig Dispense Refill   citalopram (CELEXA) 20 MG tablet Take 1 tablet (20 mg total) by mouth every evening. 30 tablet 3   ibuprofen (ADVIL,MOTRIN) 600 MG tablet Take 1 tablet (600 mg total) by mouth every 6 (six) hours. 30 tablet 0   prenatal vitamin w/FE, FA (PRENATAL 1 + 1) 27-1 MG TABS tablet Take 1 tablet by mouth at bedtime.      traZODone (DESYREL) 50 MG tablet TAKE ONE TABLET BY MOUTH EVERY NIGHT AT BEDTIME AS NEEDED FOR SLEEP 30 tablet 0   No current facility-administered medications for this visit.    Musculoskeletal:   Psychiatric Specialty Exam: Review of Systems  Cardiovascular:  Negative for chest pain.  Gastrointestinal:  Negative for nausea.  Psychiatric/Behavioral:  Negative for depression, substance abuse and suicidal ideas.     currently breastfeeding.There is no height or weight on file to calculate BMI.  General Appearance: Casual  Eye Contact:  Fair  Speech:  Normal Rate  Volume:  Normal  Mood: euthymic  Affect:  Congruent  Thought Process:  Irrelevant  Orientation:  Full (Time, Place, and Person)  Thought Content:  Logical  Suicidal Thoughts:  No  Homicidal Thoughts:  No  Memory:  Immediate;   Fair Recent;   Good  Judgement:  Fair  Insight:  Fair  Psychomotor Activity:  Normal  Concentration:  Concentration: Fair and Attention Span: Fair  Recall:  Good  Fund of Knowledge:Good  Language: Good  Akathisia:  No  Handed:  Right  AIMS (if indicated):  not done  Assets:  Financial Resources/Insurance Physical Health  ADL's:  Intact  Cognition: WNL  Sleep:  Fair   Screenings: Flowsheet Row Video Visit from 11/17/2021 in  [facility] AT [place] Video Visit from 06/14/2021 in [facility] AT [place] Video Visit from 12/07/2020 in [facility] AT [facility]  C-SSRS RISK CATEGORY No Risk No Risk No Risk       Assessment and Plan: as follows  Prior documentation reviewed   MDD mild: stable conitnue celexa 20mg  No side effects, tremors,  GAD with panic attacks:better on celexa 20mg , renewed meds  Insomnia: doing fair, seldom takes trazadone  , MD 6/21/20232:00 PM

## 2022-04-01 ENCOUNTER — Other Ambulatory Visit (HOSPITAL_COMMUNITY): Payer: Self-pay | Admitting: Psychiatry

## 2022-04-01 DIAGNOSIS — F411 Generalized anxiety disorder: Secondary | ICD-10-CM

## 2022-04-13 ENCOUNTER — Telehealth (INDEPENDENT_AMBULATORY_CARE_PROVIDER_SITE_OTHER): Payer: Commercial Managed Care - PPO | Admitting: Psychiatry

## 2022-04-13 ENCOUNTER — Encounter (HOSPITAL_COMMUNITY): Payer: Self-pay | Admitting: Psychiatry

## 2022-04-13 DIAGNOSIS — F410 Panic disorder [episodic paroxysmal anxiety] without agoraphobia: Secondary | ICD-10-CM | POA: Diagnosis not present

## 2022-04-13 DIAGNOSIS — F5102 Adjustment insomnia: Secondary | ICD-10-CM

## 2022-04-13 DIAGNOSIS — F330 Major depressive disorder, recurrent, mild: Secondary | ICD-10-CM | POA: Diagnosis not present

## 2022-04-13 NOTE — Progress Notes (Signed)
[facility] Follow up visit  Patient Identification: Patty Castillo MRN:  [id number] Date of Evaluation:  04/13/2022 Referral Source: OBGYN Chief Complaint:    anxiety  Follow up  Visit Diagnosis:    ICD-10-CM   1. MDD (major depressive disorder), recurrent episode, mild (HCC)  F33.0     2. Panic anxiety syndrome  F41.0     3. Adjustment insomnia  F51.02     Virtual Visit via Video Note  I connected with Patty Castillo on 04/13/22 at  2:30 PM EST by a video enabled telemedicine application and verified that I am speaking with the correct person using two identifiers.  Location: Patient: home Provider: home office   I discussed the limitations of evaluation and management by telemedicine and the availability of in person appointments. The patient expressed understanding and agreed to proceed.     I discussed the assessment and treatment plan with the patient. The patient was provided an opportunity to ask questions and all were answered. The patient agreed with the plan and demonstrated an understanding of the instructions.   The patient was advised to call back or seek an in-person evaluation if the symptoms worsen or if the condition fails to improve as anticipated.  I provided 115 - 20 minutes of non-face-to-face time during this encounter including chart review and documentation.   History of Present Illness: Patient is a 36 years old currently married Caucasian female referred initially by OB/GYN for management of anxiety and depression  Was doing fair with celexa 20mg  but noticing twitches of the right eye lately, not sure if med related or anxiety Overall stress related to daughter and her school , wants to wait till end of December before she decides of changing med as it may be related her and her school stress.    Has relocated from [place]  Daughter 6  years was giving difficult time in school,now home schooling   Aggravating factor: daughter stress ,  college stress in the past Modifying factor: husband, kid   Duration 6 years  Severity manageable but gets anxious   Past Psychiatric History: depression, anxiety  Previous Psychotropic Medications: No   Substance Abuse History in the last 12 months:  No.  Consequences of Substance Abuse: NA  Past Medical History:  Past Medical History:  Diagnosis Date   Anxiety    Depression    Hx of varicella     Past Surgical History:  Procedure Laterality Date   NO PAST SURGERIES      Family Psychiatric History: Uncle Bipolar, GM depression  Family History:  Family History  Problem Relation Age of Onset   Cancer Mother        breast   Hypothyroidism Mother    Breast cancer Mother    Hypertension Father    Depression Sister    Alzheimer's disease Maternal Grandmother    Diabetes Maternal Grandfather    Mental illness Paternal Grandmother    Depression Paternal Grandmother    Cancer Paternal Grandmother        breast   Breast cancer Paternal Grandmother    Cancer Paternal Grandfather    Hypothyroidism Maternal Aunt     Social History:   Social History   Socioeconomic History   Marital status: Married    Spouse name: Not on file   Number of children: Not on file   Years of education: Not on file   Highest education level: Not on file  Occupational History   Not on  file  Tobacco Use   Smoking status: Never   Smokeless tobacco: Never  Substance and Sexual Activity   Alcohol use: No   Drug use: No   Sexual activity: Yes    Birth control/protection: None  Other Topics Concern   Not on file  Social History Narrative   Not on file   Social Determinants of Health   Financial Resource Strain: Not on file  Food Insecurity: Not on file  Transportation Needs: Not on file  Physical Activity: Not on file  Stress: Not on file  Social Connections: Not on file     Allergies:   Allergies  Allergen Reactions   Doxycycline Nausea And Vomiting   Latex Hives and  Rash    Metabolic Disorder Labs: No results found for: "HGBA1C", "MPG" No results found for: "PROLACTIN" No results found for: "CHOL", "TRIG", "HDL", "CHOLHDL", "VLDL", "LDLCALC" No results found for: "TSH"  Therapeutic Level Labs: No results found for: "LITHIUM" No results found for: "CBMZ" No results found for: "VALPROATE"  Current Medications: Current Outpatient Medications  Medication Sig Dispense Refill   citalopram (CELEXA) 20 MG tablet TAKE ONE TABLET BY MOUTH EVERY EVENING 30 tablet 1   ibuprofen (ADVIL,MOTRIN) 600 MG tablet Take 1 tablet (600 mg total) by mouth every 6 (six) hours. 30 tablet 0   prenatal vitamin w/FE, FA (PRENATAL 1 + 1) 27-1 MG TABS tablet Take 1 tablet by mouth at bedtime.      traZODone (DESYREL) 50 MG tablet TAKE ONE TABLET BY MOUTH EVERY NIGHT AT BEDTIME AS NEEDED FOR SLEEP 30 tablet 0   No current facility-administered medications for this visit.    Musculoskeletal:   Psychiatric Specialty Exam: Review of Systems  Cardiovascular:  Negative for chest pain.  Gastrointestinal:  Negative for nausea.  Psychiatric/Behavioral:  Negative for depression, substance abuse and suicidal ideas.     currently breastfeeding.There is no height or weight on file to calculate BMI.  General Appearance: Casual  Eye Contact:  Fair  Speech:  Normal Rate  Volume:  Normal  Mood: euthymic  Affect:  Congruent  Thought Process:  Irrelevant  Orientation:  Full (Time, Place, and Person)  Thought Content:  Logical  Suicidal Thoughts:  No  Homicidal Thoughts:  No  Memory:  Immediate;   Fair Recent;   Good  Judgement:  Fair  Insight:  Fair  Psychomotor Activity:  Normal  Concentration:  Concentration: Fair and Attention Span: Fair  Recall:  Good  Fund of Knowledge:Good  Language: Good  Akathisia:  No  Handed:  Right  AIMS (if indicated):  not done  Assets:  Financial Resources/Insurance Physical Health  ADL's:  Intact  Cognition: WNL  Sleep:  Fair    Screenings: Flowsheet Row Video Visit from 04/13/2022 in [facility] AT [place] Video Visit from 11/17/2021 in [facility] AT [place] Video Visit from 06/14/2021 in [facility] AT [place]  C-SSRS RISK CATEGORY No Risk No Risk No Risk       Assessment and Plan: as follows Prior documentation reviewed   MDD mild: fair  continue celexa ,  GAD with panic attacks:manageable gets twitches not sure if anxiety related will wait and wants to continue celexa 20mg  for now, call back for concerns   Insomnia: doing fair, seldom takes trazadone Fu 72m.  1m, MD 11/15/20232:41 PM

## 2022-05-29 ENCOUNTER — Other Ambulatory Visit (HOSPITAL_COMMUNITY): Payer: Self-pay | Admitting: Psychiatry

## 2022-05-29 DIAGNOSIS — F411 Generalized anxiety disorder: Secondary | ICD-10-CM

## 2022-05-30 NOTE — L&D Delivery Note (Signed)
Delivery Note Patient was admitted in active labor at 7cm. Progressed to fully dilated. Patient began pushing in tub. Moved to bed after about 1 hour of pushing. At 5:24 PM a viable female was delivered via Vaginal, Spontaneous (Presentation: Middle Occiput Anterior).  APGAR: 7, 9; weight 10 lb 6 oz (4706 g).   Placenta status: Spontaneous, Intact.  Cord: 3 vessels with the following complications: None.   Anesthesia: Local Episiotomy: None Lacerations: 2nd degree Suture Repair: 2.0 vicryl Est. Blood Loss (mL): 500  Mom to postpartum.  Baby to Couplet care / Skin to Skin.  Antony Salmon D'iorio 03/18/2023, 6:21 PM

## 2022-06-24 ENCOUNTER — Encounter (HOSPITAL_COMMUNITY): Payer: Self-pay

## 2022-06-24 ENCOUNTER — Telehealth (HOSPITAL_COMMUNITY): Payer: Commercial Managed Care - PPO | Admitting: Psychiatry

## 2022-06-27 ENCOUNTER — Telehealth (HOSPITAL_COMMUNITY): Payer: Self-pay

## 2022-06-27 DIAGNOSIS — F411 Generalized anxiety disorder: Secondary | ICD-10-CM

## 2022-06-27 MED ORDER — CITALOPRAM HYDROBROMIDE 20 MG PO TABS: 30.0000 mg | ORAL_TABLET | Freq: Every evening | ORAL | 0 refills | Status: DC

## 2022-06-27 NOTE — Telephone Encounter (Signed)
Medication management - Fax from patient's [facility] for a new Citalopram order, last e-scribed 05/30/22; however, pt is reporting to them you increased to 30 mg a day. Please review for approval and send a new order if warranted.

## 2022-06-28 ENCOUNTER — Telehealth (INDEPENDENT_AMBULATORY_CARE_PROVIDER_SITE_OTHER): Payer: Commercial Managed Care - PPO | Admitting: Psychiatry

## 2022-06-28 ENCOUNTER — Encounter (HOSPITAL_COMMUNITY): Payer: Self-pay | Admitting: Psychiatry

## 2022-06-28 DIAGNOSIS — F411 Generalized anxiety disorder: Secondary | ICD-10-CM

## 2022-06-28 DIAGNOSIS — F330 Major depressive disorder, recurrent, mild: Secondary | ICD-10-CM

## 2022-06-28 DIAGNOSIS — F410 Panic disorder [episodic paroxysmal anxiety] without agoraphobia: Secondary | ICD-10-CM

## 2022-06-28 DIAGNOSIS — F5102 Adjustment insomnia: Secondary | ICD-10-CM | POA: Diagnosis not present

## 2022-06-28 MED ORDER — TRAZODONE HCL 50 MG PO TABS: ORAL_TABLET | ORAL | 0 refills | Status: DC

## 2022-06-28 NOTE — Progress Notes (Signed)
[facility] Follow up visit  Patient Identification: Patty Castillo MRN:  [id number] Date of Evaluation:  06/28/2022 Referral Source: OBGYN Chief Complaint:    anxiety  Follow up  Visit Diagnosis:    ICD-10-CM   1. MDD (major depressive disorder), recurrent episode, mild ([facility])  F33.0     2. GAD (generalized anxiety disorder)  F41.1     3. Panic anxiety syndrome  F41.0     4. Adjustment insomnia  F51.02 traZODone (DESYREL) 50 MG tablet    Virtual Visit via Video Note  I connected with Patty Castillo on 06/28/22 at  4:00 PM EST by a video enabled telemedicine application and verified that I am speaking with the correct person using two identifiers.  Location: Patient: home Provider: office   I discussed the limitations of evaluation and management by telemedicine and the availability of in person appointments. The patient expressed understanding and agreed to proceed.      I discussed the assessment and treatment plan with the patient. The patient was provided an opportunity to ask questions and all were answered. The patient agreed with the plan and demonstrated an understanding of the instructions.   The patient was advised to call back or seek an in-person evaluation if the symptoms worsen or if the condition fails to improve as anticipated.  I provided 15  minutes of non-face-to-face time during this encounter.       History of Present Illness: Patient is a 37 years old currently married Caucasian female referred initially by OB/GYN for management of anxiety and depression  Was having stress, increased celexa she increased to 30mg  doing better, denies tics or tremors Depression better, denies panic attacks  Sleeps fair but still needs trazadone at hand     Has relocated from [place]  Daughter 6  years was giving difficult time in school,now home schooling   Aggravating factor: daughter stress ,  college stress in the past Modifying factor: husband,  kid   Duration 6 years  Severity manageable and improved   Past Psychiatric History: depression, anxiety  Previous Psychotropic Medications: No   Substance Abuse History in the last 12 months:  No.  Consequences of Substance Abuse: NA  Past Medical History:  Past Medical History:  Diagnosis Date   Anxiety    Depression    Hx of varicella     Past Surgical History:  Procedure Laterality Date   NO PAST SURGERIES      Family Psychiatric History: Uncle Bipolar, GM depression  Family History:  Family History  Problem Relation Age of Onset   Cancer Mother        breast   Hypothyroidism Mother    Breast cancer Mother    Hypertension Father    Depression Sister    Alzheimer's disease Maternal Grandmother    Diabetes Maternal Grandfather    Mental illness Paternal Grandmother    Depression Paternal Grandmother    Cancer Paternal Grandmother        breast   Breast cancer Paternal Grandmother    Cancer Paternal Grandfather    Hypothyroidism Maternal Aunt     Social History:   Social History   Socioeconomic History   Marital status: Married    Spouse name: Not on file   Number of children: Not on file   Years of education: Not on file   Highest education level: Not on file  Occupational History   Not on file  Tobacco Use   Smoking status: Never  Smokeless tobacco: Never  Substance and Sexual Activity   Alcohol use: No   Drug use: No   Sexual activity: Yes    Birth control/protection: None  Other Topics Concern   Not on file  Social History Narrative   Not on file   Social Determinants of Health   Financial Resource Strain: Not on file  Food Insecurity: Not on file  Transportation Needs: Not on file  Physical Activity: Not on file  Stress: Not on file  Social Connections: Not on file     Allergies:   Allergies  Allergen Reactions   Doxycycline Nausea And Vomiting   Latex Hives and Rash    Metabolic Disorder Labs: No results found for:  "HGBA1C", "MPG" No results found for: "PROLACTIN" No results found for: "CHOL", "TRIG", "HDL", "CHOLHDL", "VLDL", "LDLCALC" No results found for: "TSH"  Therapeutic Level Labs: No results found for: "LITHIUM" No results found for: "CBMZ" No results found for: "VALPROATE"  Current Medications: Current Outpatient Medications  Medication Sig Dispense Refill   citalopram (CELEXA) 20 MG tablet Take 1.5 tablets (30 mg total) by mouth every evening. 45 tablet 0   ibuprofen (ADVIL,MOTRIN) 600 MG tablet Take 1 tablet (600 mg total) by mouth every 6 (six) hours. 30 tablet 0   prenatal vitamin w/FE, FA (PRENATAL 1 + 1) 27-1 MG TABS tablet Take 1 tablet by mouth at bedtime.      traZODone (DESYREL) 50 MG tablet Take one at night 30 tablet 0   No current facility-administered medications for this visit.    Musculoskeletal:   Psychiatric Specialty Exam: Review of Systems  Cardiovascular:  Negative for chest pain.  Gastrointestinal:  Negative for nausea.  Psychiatric/Behavioral:  Negative for depression, substance abuse and suicidal ideas.     currently breastfeeding.There is no height or weight on file to calculate BMI.  General Appearance: Casual  Eye Contact:  Fair  Speech:  Normal Rate  Volume:  Normal  Mood: euthymic  Affect:  Congruent  Thought Process:  Irrelevant  Orientation:  Full (Time, Place, and Person)  Thought Content:  Logical  Suicidal Thoughts:  No  Homicidal Thoughts:  No  Memory:  Immediate;   Fair Recent;   Good  Judgement:  Fair  Insight:  Fair  Psychomotor Activity:  Normal  Concentration:  Concentration: Fair and Attention Span: Fair  Recall:  Good  Fund of Knowledge:Good  Language: Good  Akathisia:  No  Handed:  Right  AIMS (if indicated):  not done  Assets:  Financial Resources/Insurance Physical Health  ADL's:  Intact  Cognition: WNL  Sleep:  Fair   Screenings: Flowsheet Row Video Visit from 04/13/2022 in [place] at [facility] Video Visit from 11/17/2021 in [facility] at [facility] Video Visit from 06/14/2021 in [place] at [place] [facility] No Risk No Risk No Risk       Assessment and Plan: as follows  Prior documentation reviewed   MDD mild: fair continue celexa  ,  GAD with panic attacks:improved continue celexa 30mg     Insomnia: doing stable, seldom takes trazadone but needs it just in case  Follow with sleep hygiene Fu 3m.  Merian Capron, MD 1/30/20244:09 PM

## 2022-07-13 ENCOUNTER — Telehealth (INDEPENDENT_AMBULATORY_CARE_PROVIDER_SITE_OTHER): Payer: Commercial Managed Care - PPO | Admitting: Psychiatry

## 2022-07-13 ENCOUNTER — Encounter (HOSPITAL_COMMUNITY): Payer: Self-pay | Admitting: Psychiatry

## 2022-07-13 DIAGNOSIS — F410 Panic disorder [episodic paroxysmal anxiety] without agoraphobia: Secondary | ICD-10-CM | POA: Diagnosis not present

## 2022-07-13 DIAGNOSIS — F330 Major depressive disorder, recurrent, mild: Secondary | ICD-10-CM

## 2022-07-13 DIAGNOSIS — F411 Generalized anxiety disorder: Secondary | ICD-10-CM

## 2022-07-13 NOTE — Progress Notes (Signed)
[facility] Follow up visit  Patient Identification: Patty Castillo MRN:  [id number] Date of Evaluation:  07/13/2022 Referral Source: OBGYN Chief Complaint:    anxiety  Follow up  Visit Diagnosis:  Virtual Visit via Video Note  I connected with Patty Castillo on 07/13/22 at  2:15 PM EST by a video enabled telemedicine application and verified that I am speaking with the correct person using two identifiers.  Location: Patient: home  Provider: home office   I discussed the limitations of evaluation and management by telemedicine and the availability of in person appointments. The patient expressed understanding and agreed to proceed.      I discussed the assessment and treatment plan with the patient. The patient was provided an opportunity to ask questions and all were answered. The patient agreed with the plan and demonstrated an understanding of the instructions.   The patient was advised to call back or seek an in-person evaluation if the symptoms worsen or if the condition fails to improve as anticipated.  I provided 15 minutes of non-face-to-face time during this encounter.      History of Present Illness: Patient is a 37 years old currently married Caucasian female referred initially by OB/GYN for management of anxiety and depression  Early visit pregnant [redacted] weeks, discussed meds  Feel anxious, at times restless, says celexa has helped last 2 pregnancies at 11m Discussed options to increase, keep same or add buspar Has OB appointment in 2 weeks  Husband is supportive  Risk benefit ration of being on med during first trimester discussed and she is aware      Has relocated from [place] Daughter 6  years was giving difficult time in school,now home schooling   Aggravating factor: daughter stress ,  college stress in the past Modifying factor: husband, kid   Duration 6 years  Severity manageable and improved   Past Psychiatric History: depression,  anxiety  Previous Psychotropic Medications: No   Substance Abuse History in the last 12 months:  No.  Consequences of Substance Abuse: NA  Past Medical History:  Past Medical History:  Diagnosis Date   Anxiety    Depression    Hx of varicella     Past Surgical History:  Procedure Laterality Date   NO PAST SURGERIES      Family Psychiatric History: Uncle Bipolar, GM depression  Family History:  Family History  Problem Relation Age of Onset   Cancer Mother        breast   Hypothyroidism Mother    Breast cancer Mother    Hypertension Father    Depression Sister    Alzheimer's disease Maternal Grandmother    Diabetes Maternal Grandfather    Mental illness Paternal Grandmother    Depression Paternal Grandmother    Cancer Paternal Grandmother        breast   Breast cancer Paternal Grandmother    Cancer Paternal Grandfather    Hypothyroidism Maternal Aunt     Social History:   Social History   Socioeconomic History   Marital status: Married    Spouse name: Not on file   Number of children: Not on file   Years of education: Not on file   Highest education level: Not on file  Occupational History   Not on file  Tobacco Use   Smoking status: Never   Smokeless tobacco: Never  Substance and Sexual Activity   Alcohol use: No   Drug use: No   Sexual activity: Yes  Birth control/protection: None  Other Topics Concern   Not on file  Social History Narrative   Not on file   Social Determinants of Health   Financial Resource Strain: Not on file  Food Insecurity: Not on file  Transportation Needs: Not on file  Physical Activity: Not on file  Stress: Not on file  Social Connections: Not on file     Allergies:   Allergies  Allergen Reactions   Doxycycline Nausea And Vomiting   Latex Hives and Rash    Metabolic Disorder Labs: No results found for: "HGBA1C", "MPG" No results found for: "PROLACTIN" No results found for: "CHOL", "TRIG", "HDL",  "CHOLHDL", "VLDL", "LDLCALC" No results found for: "TSH"  Therapeutic Level Labs: No results found for: "LITHIUM" No results found for: "CBMZ" No results found for: "VALPROATE"  Current Medications: Current Outpatient Medications  Medication Sig Dispense Refill   citalopram (CELEXA) 20 MG tablet Take 1.5 tablets (30 mg total) by mouth every evening. 45 tablet 0   ibuprofen (ADVIL,MOTRIN) 600 MG tablet Take 1 tablet (600 mg total) by mouth every 6 (six) hours. 30 tablet 0   prenatal vitamin w/FE, FA (PRENATAL 1 + 1) 27-1 MG TABS tablet Take 1 tablet by mouth at bedtime.      traZODone (DESYREL) 50 MG tablet Take one at night 30 tablet 0   No current facility-administered medications for this visit.    Musculoskeletal:   Psychiatric Specialty Exam: Review of Systems  Cardiovascular:  Negative for chest pain.  Gastrointestinal:  Negative for nausea.  Psychiatric/Behavioral:  Negative for depression, substance abuse and suicidal ideas.     currently breastfeeding.There is no height or weight on file to calculate BMI.  General Appearance: Casual  Eye Contact:  Fair  Speech:  Normal Rate  Volume:  Normal  Mood: euthymic, somewhat stressed  Affect:  Congruent  Thought Process:  Irrelevant  Orientation:  Full (Time, Place, and Person)  Thought Content:  Logical  Suicidal Thoughts:  No  Homicidal Thoughts:  No  Memory:  Immediate;   Fair Recent;   Good  Judgement:  Fair  Insight:  Fair  Psychomotor Activity:  Normal  Concentration:  Concentration: Fair and Attention Span: Fair  Recall:  Good  Fund of Knowledge:Good  Language: Good  Akathisia:  No  Handed:  Right  AIMS (if indicated):  not done  Assets:  Financial Resources/Insurance Physical Health  ADL's:  Intact  Cognition: WNL  Sleep:  Fair   Screenings: Flowsheet Row Video Visit from 04/13/2022 in [place] at [facility] Video Visit from 11/17/2021 in [place] at [facility] Video Visit from 06/14/2021 in [place] at [place] No Risk No Risk No Risk       Assessment and Plan: as follows  Prior documentation reviewed   MDD mild:fair continue clexa Pregnancy discussed meds, she is opting to keep celexa 54m , discussed risk benefit And other options ,  GAD with panic attacks:someshat stressed see above, continue celexa   Insomnia: manageble continue meds/sleep hygiene, has OB appointment to review meds and pregnancy as well  Fu 124m NaMerian CapronMD 2/14/20242:23 PM

## 2022-07-14 ENCOUNTER — Telehealth (HOSPITAL_COMMUNITY): Payer: Self-pay | Admitting: Psychiatry

## 2022-07-14 DIAGNOSIS — F411 Generalized anxiety disorder: Secondary | ICD-10-CM

## 2022-07-14 MED ORDER — CITALOPRAM HYDROBROMIDE 20 MG PO TABS: 30.0000 mg | ORAL_TABLET | Freq: Every evening | ORAL | 0 refills | Status: DC

## 2022-07-14 NOTE — Telephone Encounter (Signed)
Medication management - Telephone message left for patient that Dr. De Nurse had sent in her requested new Citalopram order to her [facility] on [facility]. Patient to call back if any issues filling.

## 2022-07-14 NOTE — Telephone Encounter (Signed)
Patient called stating she is going out of town and requests refill of:  citalopram (CELEXA) 20 MG tablet   [facility] [id number] - [place], [place] (Ph: [phone])   Last ordered: 06/27/2022 - 45 tablets  Last visit: 07/13/2022  Next visit: 08/03/2022

## 2022-07-20 ENCOUNTER — Telehealth (HOSPITAL_COMMUNITY): Payer: Commercial Managed Care - PPO | Admitting: Psychiatry

## 2022-08-03 ENCOUNTER — Encounter (HOSPITAL_COMMUNITY): Payer: Self-pay | Admitting: Psychiatry

## 2022-08-03 ENCOUNTER — Telehealth (INDEPENDENT_AMBULATORY_CARE_PROVIDER_SITE_OTHER): Payer: Commercial Managed Care - PPO | Admitting: Psychiatry

## 2022-08-03 DIAGNOSIS — F410 Panic disorder [episodic paroxysmal anxiety] without agoraphobia: Secondary | ICD-10-CM

## 2022-08-03 DIAGNOSIS — F330 Major depressive disorder, recurrent, mild: Secondary | ICD-10-CM | POA: Diagnosis not present

## 2022-08-03 DIAGNOSIS — F411 Generalized anxiety disorder: Secondary | ICD-10-CM

## 2022-08-03 MED ORDER — CITALOPRAM HYDROBROMIDE 40 MG PO TABS: 40.0000 mg | ORAL_TABLET | Freq: Every evening | ORAL | 0 refills | Status: DC

## 2022-08-03 NOTE — Progress Notes (Signed)
[facility] Follow up visit  Patient Identification: Patty Castillo MRN:  [id number] Date of Evaluation:  08/03/2022 Referral Source: OBGYN Chief Complaint:    anxiety  Follow up  Visit Diagnosis:  GAD, panic attacks  Virtual Visit via Video Note  I connected with [name] on 08/03/22 at  3:00 PM EST by a video enabled telemedicine application and verified that I am speaking with the correct person using two identifiers.  Location: Patient: parked car Provider: home office   I discussed the limitations of evaluation and management by telemedicine and the availability of in person appointments. The patient expressed understanding and agreed to proceed.     I discussed the assessment and treatment plan with the patient. The patient was provided an opportunity to ask questions and all were answered. The patient agreed with the plan and demonstrated an understanding of the instructions.   The patient was advised to call back or seek an in-person evaluation if the symptoms worsen or if the condition fails to improve as anticipated.  I provided 15 minutes of non-face-to-face time during this encounter.   History of Present Illness: Patient is a 37 years old currently married Caucasian female referred initially by OB/GYN for management of anxiety and depression  Pregnancy now 7 weeks, was feeling dow, anxious last visit, wanted to continue celexa '30mg'$ , considering pregnancy  Now has connected with OB and ok'd to increase to  '40mg'$ , doing better Has used '40mg'$  last pregnancies as well  Husband is supportive  Risk benefit ratio  of being on med during first trimester discussed and she is aware  Has nausea during pregnancy so it can effect mood but handling it better now    Has relocated from [place]  Daughter 6  years was giving difficult time in school,now home schooling   Aggravating factor: daughers stress   Modifying factor: husband, kid   Duration 6  years  Severity better   Past Psychiatric History: depression, anxiety  Previous Psychotropic Medications: No   Substance Abuse History in the last 12 months:  No.  Consequences of Substance Abuse: NA  Past Medical History:  Past Medical History:  Diagnosis Date   Anxiety    Depression    Hx of varicella     Past Surgical History:  Procedure Laterality Date   NO PAST SURGERIES      Family Psychiatric History: Uncle Bipolar, GM depression  Family History:  Family History  Problem Relation Age of Onset   Cancer Mother        breast   Hypothyroidism Mother    Breast cancer Mother    Hypertension Father    Depression Sister    Alzheimer's disease Maternal Grandmother    Diabetes Maternal Grandfather    Mental illness Paternal Grandmother    Depression Paternal Grandmother    Cancer Paternal Grandmother        breast   Breast cancer Paternal Grandmother    Cancer Paternal Grandfather    Hypothyroidism Maternal Aunt     Social History:   Social History   Socioeconomic History   Marital status: Married    Spouse name: Not on file   Number of children: Not on file   Years of education: Not on file   Highest education level: Not on file  Occupational History   Not on file  Tobacco Use   Smoking status: Never   Smokeless tobacco: Never  Substance and Sexual Activity   Alcohol use: No  Drug use: No   Sexual activity: Yes    Birth control/protection: None  Other Topics Concern   Not on file  Social History Narrative   Not on file   Social Determinants of Health   Financial Resource Strain: Not on file  Food Insecurity: Not on file  Transportation Needs: Not on file  Physical Activity: Not on file  Stress: Not on file  Social Connections: Not on file     Allergies:   Allergies  Allergen Reactions   Doxycycline Nausea And Vomiting   Latex Hives and Rash    Metabolic Disorder Labs: No results found for: "HGBA1C", "MPG" No results found  for: "PROLACTIN" No results found for: "CHOL", "TRIG", "HDL", "CHOLHDL", "VLDL", "LDLCALC" No results found for: "TSH"  Therapeutic Level Labs: No results found for: "LITHIUM" No results found for: "CBMZ" No results found for: "VALPROATE"  Current Medications: Current Outpatient Medications  Medication Sig Dispense Refill   citalopram (CELEXA) 40 MG tablet Take 1 tablet (40 mg total) by mouth every evening. 30 tablet 0   ibuprofen (ADVIL,MOTRIN) 600 MG tablet Take 1 tablet (600 mg total) by mouth every 6 (six) hours. 30 tablet 0   prenatal vitamin w/FE, FA (PRENATAL 1 + 1) 27-1 MG TABS tablet Take 1 tablet by mouth at bedtime.      traZODone (DESYREL) 50 MG tablet Take one at night 30 tablet 0   No current facility-administered medications for this visit.    Musculoskeletal:   Psychiatric Specialty Exam: Review of Systems  Cardiovascular:  Negative for chest pain.  Gastrointestinal:  Negative for nausea.  Psychiatric/Behavioral:  Negative for depression, substance abuse and suicidal ideas.     currently breastfeeding.There is no height or weight on file to calculate BMI.  General Appearance: Casual  Eye Contact:  Fair  Speech:  Normal Rate  Volume:  Normal  Mood: better  Affect:  Congruent  Thought Process:  Irrelevant  Orientation:  Full (Time, Place, and Person)  Thought Content:  Logical  Suicidal Thoughts:  No  Homicidal Thoughts:  No  Memory:  Immediate;   Fair Recent;   Good  Judgement:  Fair  Insight:  Fair  Psychomotor Activity:  Normal  Concentration:  Concentration: Fair and Attention Span: Fair  Recall:  Good  Fund of Knowledge:Good  Language: Good  Akathisia:  No  Handed:  Right  AIMS (if indicated):  not done  Assets:  Financial Resources/Insurance Physical Health  ADL's:  Intact  Cognition: WNL  Sleep:  Fair   Screenings: Flowsheet Row Video Visit from 04/13/2022 in [place] at [facility] Video Visit  from 11/17/2021 in [place] at [facility] Video Visit from 06/14/2021 in [place] at [place] No Risk No Risk No Risk       Assessment and Plan: as follows  Prior documentation reviewed   MDD mild: better continue celexa   ,  GAD with panic attacks:: stress better , continue celexa '40mg'$  , got from OB   Insomnia: still flutuates, trying to work on without trazadone  Fu 3- 81m Contine with OB for concerns as well   NMerian Capron MD 3/6/20243:11 PM

## 2022-08-27 ENCOUNTER — Other Ambulatory Visit (HOSPITAL_COMMUNITY): Payer: Self-pay | Admitting: Psychiatry

## 2022-08-27 DIAGNOSIS — F5102 Adjustment insomnia: Secondary | ICD-10-CM

## 2022-09-10 ENCOUNTER — Encounter (HOSPITAL_COMMUNITY): Payer: Self-pay

## 2022-09-10 ENCOUNTER — Inpatient Hospital Stay (HOSPITAL_COMMUNITY)
Admission: AD | Admit: 2022-09-10 | Discharge: 2022-09-10 | Disposition: A | Discharge status: Home / Self Care | Payer: Commercial Managed Care - PPO | Attending: Obstetrics and Gynecology | Admitting: Obstetrics and Gynecology

## 2022-09-10 DIAGNOSIS — O99341 Other mental disorders complicating pregnancy, first trimester: Secondary | ICD-10-CM | POA: Diagnosis not present

## 2022-09-10 DIAGNOSIS — F419 Anxiety disorder, unspecified: Secondary | ICD-10-CM | POA: Diagnosis not present

## 2022-09-10 DIAGNOSIS — F411 Generalized anxiety disorder: Secondary | ICD-10-CM | POA: Diagnosis not present

## 2022-09-10 DIAGNOSIS — O26891 Other specified pregnancy related conditions, first trimester: Secondary | ICD-10-CM | POA: Insufficient documentation

## 2022-09-10 DIAGNOSIS — F410 Panic disorder [episodic paroxysmal anxiety] without agoraphobia: Secondary | ICD-10-CM | POA: Insufficient documentation

## 2022-09-10 DIAGNOSIS — F331 Major depressive disorder, recurrent, moderate: Secondary | ICD-10-CM | POA: Insufficient documentation

## 2022-09-10 DIAGNOSIS — Z3A13 13 weeks gestation of pregnancy: Secondary | ICD-10-CM | POA: Diagnosis not present

## 2022-09-10 MED ORDER — ALPRAZOLAM 0.25 MG PO TABS: 0.2500 mg | ORAL_TABLET | ORAL | 0 refills | Status: DC | PRN

## 2022-09-10 NOTE — MAU Note (Signed)
.  Patty Castillo is a 37 y.o. at [redacted]w[redacted]d here in MAU reporting: since Thursday she has been having very bad panic attacks and worsening depression. Denies thoughts of self harm, but states she does not feel like she can get out of bed or make it through the day. States that when she has a bad panic attack her abdomen gets tight. Denies abdominal pain or vaginal bleeding currently. Also reports a headache.   Onset of complaint: thursday Pain score: 5 Vitals:   09/10/22 1641  BP: 103/67  Pulse: 91  Resp: 14  Temp: 98.4 F (36.9 C)  SpO2: 100%     FHT:[id number]

## 2022-09-10 NOTE — Consult Note (Signed)
[facility] ED ASSESSMENT   Reason for Consult:  depression Referring Physician:  Druscilla Brownie Patient Identification: Patty Castillo MRN:  [id number] ED Chief Complaint: Major depressive disorder, recurrent episode, moderate  Diagnosis:  Principal Problem:   Major depressive disorder, recurrent episode, moderate Active Problems:   Generalized anxiety disorder   ED Assessment Time Calculation: Start Time: 1730 Stop Time: 1830 Total Time in Minutes (Assessment Completion): 60   HPI:   Patty Castillo is a 37 y.o. female patient at [redacted]w[redacted]d here in MAU reporting: since Thursday she has been having very bad panic attacks and worsening depression. Denies thoughts of self harm, but states she does not feel like she can get out of bed or make it through the day. States that when she has a bad panic attack her abdomen gets tight. Denies abdominal pain or vaginal bleeding currently. Also reports a headache.   Subjective:   Patient seen at [facility] for psychiatric evaluation. Pt is alone in the room for assessment. She explained that she has been taking Celexa for years and usually it works well. Her last pregnancy a few years ago she also had worsening anxiety and depression, and her Celexa was increased to 40 mg which worked very well for her. She eventually went back down to 30 mg. She is now [redacted] weeks pregnant, and experiencing the same symptoms as the last pregnancy. She has had worsening depression with decreased motivation, anhedonia, oversleeping, lack of energy. Her outpatient provider, Dr. Gilmore Laroche increased her Celexa back to 40 mg around 4 weeks ago. Pt reports no improvements, and is starting to worry Celexa does not work for her anymore and potentially requesting medication change. She denies suicidal thoughts. Pt stated "sometimes I think if I didn't wake up from my sleep I would be fine with it but I don't want to kill myself. I don't want to do that to my family." She denies any history of  suicide attempts or self injurious behaviors. She denies HI. Denies AVH. Denies illicit substance or alcohol use.  Pt is seeing a therapist once per week. She has only had a couple of sessions but patient does not feel significant improvement of symptoms with it. We spoke about med changes, and the risks that come with that especially while pregnant. Also consulted with supervising physician Dr. Lucianne Muss. We determined no medication changes while in the ED and to follow up with Dr. Gilmore Laroche. Recommended she wait another 4-6 weeks from Celexa increase to determine if it is effective before stating it isn't working anymore. Pt is agreeable with this plan. We also talked about having extra support during this time such as intensive outpatient services. Pt is very interested in this, I reached out to [employer] and [facility] [employer] for a referral, IOP should be reaching out to her on Monday to potentially set up services. I offered inpatient admission, however the patient declined and is able to contract for safety at this time, she is requesting discharge home.  Patient's Husband, Onalee Hua, joined [place] in the room and is also agreeable with the plan. He has no safety concerns for the patient returning home tonight. He expressed if symptoms worsen he will bring her back to the emergency department. Patient is psychiatrically cleared.   Past Psychiatric History:  MDD, GAD  Risk to Self or Others: Is the patient at risk to self? No Has the patient been a risk to self in the past 6 months? No Has the patient been a risk to  self within the distant past? No Is the patient a risk to others? No Has the patient been a risk to others in the past 6 months? No Has the patient been a risk to others within the distant past? No  [place] Scale:  Flowsheet Row Admission (Current) from 09/10/2022 in [facility] M[facility] Assessment [facility] Video Visit from 04/13/2022 in [facility] at [facility] Video Visit from 11/17/2021 in [facility] at [facility]  C-SSRS RISK CATEGORY Low Risk No Risk No Risk       Past Medical History:  Past Medical History:  Diagnosis Date   Anxiety    Depression    Hx of varicella     Past Surgical History:  Procedure Laterality Date   NO PAST SURGERIES     Family History:  Family History  Problem Relation Age of Onset   Cancer Mother        breast   Hypothyroidism Mother    Breast cancer Mother    Hypertension Father    Depression Sister    Alzheimer's disease Maternal Grandmother    Diabetes Maternal Grandfather    Mental illness Paternal Grandmother    Depression Paternal Grandmother    Cancer Paternal Grandmother        breast   Breast cancer Paternal Grandmother    Cancer Paternal Grandfather    Hypothyroidism Maternal Aunt    Social History:  Social History   Substance and Sexual Activity  Alcohol Use No     Social History   Substance and Sexual Activity  Drug Use No    Social History   Socioeconomic History   Marital status: Married    Spouse name: Not on file   Number of children: Not on file   Years of education: Not on file   Highest education level: Not on file  Occupational History   Not on file  Tobacco Use   Smoking status: Never   Smokeless tobacco: Never  Substance and Sexual Activity   Alcohol use: No   Drug use: No   Sexual activity: Yes    Birth control/protection: None  Other Topics Concern   Not on file  Social History Narrative   Not on file   Social Determinants of Health   Financial Resource Strain: Not on file  Food Insecurity: Not on file  Transportation Needs: Not on file  Physical Activity: Not on file  Stress: Not on file  Social Connections: Not on file   Additional Social History:    Allergies:   Allergies  Allergen Reactions   Doxycycline Nausea And Vomiting   Latex Hives and Rash    Labs: No results found for  this or any previous visit (from the past 48 hour(s)).  No current facility-administered medications for this encounter.   Psychiatric Specialty Exam: Presentation  General Appearance:  Appropriate for Environment  Eye Contact: Good  Speech: Clear and Coherent  Speech Volume: Normal  Handedness:No data recorded  Mood and Affect  Mood: Depressed; Anxious  Affect: Congruent   Thought Process  Thought Processes: Coherent  Descriptions of Associations:Intact  Orientation:Full (Time, Place and Person)  Thought Content:Logical  History of Schizophrenia/Schizoaffective disorder:No data recorded Duration of Psychotic Symptoms:No data recorded Hallucinations:Hallucinations: None  Ideas of Reference:None  Suicidal Thoughts:Suicidal Thoughts: No  Homicidal Thoughts:Homicidal Thoughts: No   Sensorium  Memory: Immediate Good; Recent Good  Judgment: Good  Insight: Good   Executive Functions  Concentration: Good  Attention Span: Good  Recall: Good  Fund of Knowledge: Good  Language: Good   Psychomotor Activity  Psychomotor Activity: Psychomotor Activity: Normal   Assets  Assets: Desire for Improvement; Leisure Time; Physical Health; Resilience    Sleep  Sleep: Sleep: Good   Physical Exam: Physical Exam Neurological:     Mental Status: She is alert and oriented to person, place, and time.  Psychiatric:        Attention and Perception: Attention normal.        Mood and Affect: Mood is anxious and depressed.        Speech: Speech normal.        Behavior: Behavior is cooperative.        Thought Content: Thought content normal.    Review of Systems  Constitutional:        [redacted] weeks pregnant  Psychiatric/Behavioral:  Positive for depression. The patient is nervous/anxious.    Blood pressure 103/67, pulse 91, temperature 98.4 F (36.9 C), temperature source Oral, resp. rate 14, weight 54.1 kg, SpO2 100 %, currently breastfeeding.  Body mass index is 18.67 kg/m.  Medical Decision Making: Pt case reviewed and discussed with Dr. Lucianne Muss. Inpatient was offered, however the patient declined and is requesting to go home. She is able to contract for safety, denies SI/HI/AVH. She does not meet [place] IVC criteria at this time. Will psychiatrically clear patient.   - Continue Celexa 40 mg  - Please continue to follow up with OP provider about possible future medication changes  Disposition: No evidence of imminent risk to self or others at present.   Patient does not meet criteria for psychiatric inpatient admission. Supportive therapy provided about ongoing stressors. Refer to IOP. Discussed crisis plan, support from social network, calling 911, coming to the Emergency Department, and calling Suicide Hotline.  Eligha Bridegroom, NP 09/10/2022 6:28 PM

## 2022-09-10 NOTE — Discharge Instructions (Addendum)
  Outpatient psychiatric Services  Walk in hours for medication management Monday, Wednesday, Thursday, and Friday from 8:00 AM to 11:00 AM Recommend arriving by by 7:30 AM.  It is first come first serve.    Walk in hours for therapy intake Monday and Wednesday only 8:00 AM to 11:00 AM Encouraged to arrive by 7:30 AM.  It is first come first serve   Inpatient patient psychiatric services [facility] offers comprehensive behavioral heath care services for mental health and substance abuse treatment.  Social work can also assist with referral to or getting you into a rehabilitation program short or long term     [facility] Obstetrics and Gynecology - [place] at [facility] [address]  [place], [place] [address]  Bernerd Pho CNM

## 2022-09-10 NOTE — MAU Provider Note (Signed)
History     CSN: [id number]  Arrival date and time: 09/10/22 1629   Event Date/Time   First Provider Initiated Contact with Patient 09/10/22 1658      Chief Complaint  Patient presents with   Panic Attack   HPI  Patty Castillo is a 37 y.o. G3P2002 at [redacted]w[redacted]d who presents for evaluation of worsening anxiety and depression. Patient reports she has always struggled with these symptoms but it is worse in pregnancy. She recently increased her Celexa to 40mg  daily and feels like this is not helping. She reports she is having multiple panic attacks today and feels like she does not want to live. She states she wants to go to sleep and not wake up. She was instructed by her OB to come in for evaluation. Patient has a psychiatrist that she sees and therapist. She feels like she has good support at home.   OB History     Gravida  3   Para  2   Term  2   Preterm      AB      Living  2      SAB      IAB      Ectopic      Multiple  0   Live Births  2           Past Medical History:  Diagnosis Date   Anxiety    Depression    Hx of varicella     Past Surgical History:  Procedure Laterality Date   NO PAST SURGERIES      Family History  Problem Relation Age of Onset   Cancer Mother        breast   Hypothyroidism Mother    Breast cancer Mother    Hypertension Father    Depression Sister    Alzheimer's disease Maternal Grandmother    Diabetes Maternal Grandfather    Mental illness Paternal Grandmother    Depression Paternal Grandmother    Cancer Paternal Grandmother        breast   Breast cancer Paternal Grandmother    Cancer Paternal Grandfather    Hypothyroidism Maternal Aunt     Social History   Tobacco Use   Smoking status: Never   Smokeless tobacco: Never  Substance Use Topics   Alcohol use: No   Drug use: No    Allergies:  Allergies  Allergen Reactions   Doxycycline Nausea And Vomiting   Latex Hives and Rash    Medications  Prior to Admission  Medication Sig Dispense Refill Last Dose   citalopram (CELEXA) 40 MG tablet Take 1 tablet (40 mg total) by mouth every evening. 30 tablet 0 09/09/2022   prenatal vitamin w/FE, FA (PRENATAL 1 + 1) 27-1 MG TABS tablet Take 1 tablet by mouth at bedtime.    09/10/2022   ibuprofen (ADVIL,MOTRIN) 600 MG tablet Take 1 tablet (600 mg total) by mouth every 6 (six) hours. 30 tablet 0    traZODone (DESYREL) 50 MG tablet TAKE ONE TABLET BY MOUTH EVERY NIGHT AT BEDTIME AS NEEDED FOR SLEEP 30 tablet 0     Review of Systems  Constitutional: Negative.  Negative for fatigue and fever.  HENT: Negative.    Respiratory: Negative.  Negative for shortness of breath.   Cardiovascular: Negative.  Negative for chest pain.  Gastrointestinal: Negative.  Negative for abdominal pain, constipation, diarrhea, nausea and vomiting.  Genitourinary: Negative.  Negative for dysuria.  Neurological: Negative.  Negative for  dizziness and headaches.  Psychiatric/Behavioral:  Positive for suicidal ideas. The patient is nervous/anxious.    Physical Exam   Blood pressure 102/61, pulse 82, temperature 98.4 F (36.9 C), temperature source Oral, resp. rate 14, weight 54.1 kg, SpO2 100 %, currently breastfeeding.  Patient Vitals for the past 24 hrs:  BP Temp Temp src Pulse Resp SpO2 Weight  09/10/22 1850 102/61 -- -- 82 -- -- --  09/10/22 1641 103/67 98.4 F (36.9 C) Oral 91 14 100 % 54.1 kg    Physical Exam Vitals and nursing note reviewed.  Constitutional:      General: She is not in acute distress.    Appearance: She is well-developed.  HENT:     Head: Normocephalic.  Eyes:     Pupils: Pupils are equal, round, and reactive to light.  Cardiovascular:     Rate and Rhythm: Normal rate and regular rhythm.     Heart sounds: Normal heart sounds.  Pulmonary:     Effort: Pulmonary effort is normal. No respiratory distress.     Breath sounds: Normal breath sounds.  Abdominal:     General: Bowel sounds are  normal. There is no distension.     Palpations: Abdomen is soft.     Tenderness: There is no abdominal tenderness.  Skin:    General: Skin is warm and dry.  Neurological:     Mental Status: She is alert and oriented to person, place, and time.  Psychiatric:        Mood and Affect: Mood is anxious and depressed. Affect is tearful.        Behavior: Behavior normal.        Thought Content: Thought content normal.        Judgment: Judgment normal.     FHT: 159 bpm   MAU Course  Procedures  MDM  Consult to TTS to ensure patient safety.  Cleared by TTS and resources provided for intensive outpatient therapy  CNM consulted with Dr. Macon Large to develop temporary plan for the weekend to manage anxiety- ok to send small RX of benzodiazapine to pharmacy to manage panic attacks until outpatient therapy team speaks to patient on Monday.   Information for perinatal mental health practitioners in the area provided to patient  Assessment and Plan   1. Anxiety during pregnancy   2. [redacted] weeks gestation of pregnancy     -Discharge home in stable condition -Rx for limited number of xanax sent to pharmacy -SI/HI precautions discussed -Patient advised to follow-up with OB as scheduled for prenatal care -Patient may return to MAU as needed or if her condition were to change or worsen  Rolm Bookbinder, CNM 09/10/2022, 4:58 PM

## 2022-09-12 ENCOUNTER — Telehealth (HOSPITAL_COMMUNITY): Payer: Self-pay | Admitting: Psychiatry

## 2022-09-12 NOTE — Telephone Encounter (Signed)
D:  Eligha Bridegroom, NP referred pt to MH-IOP.  A:  Placed call and oriented pt.  Pt states she is [redacted] weeks pregnant.  Pt is scheduled for a CCA tomorrow at 9:30 a.m, with the case manager.  Inform Mikaela.  R:  Pt receptive.

## 2022-09-13 ENCOUNTER — Other Ambulatory Visit (HOSPITAL_COMMUNITY): Payer: Commercial Managed Care - PPO | Attending: Psychiatry | Admitting: Psychiatry
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
## 2022-09-14 ENCOUNTER — Other Ambulatory Visit (HOSPITAL_COMMUNITY)
Payer: Commercial Managed Care - PPO | Attending: Licensed Clinical Social Worker | Admitting: Licensed Clinical Social Worker

## 2022-09-14 ENCOUNTER — Encounter (HOSPITAL_COMMUNITY): Payer: Self-pay | Admitting: Psychiatry

## 2022-09-14 DIAGNOSIS — O99341 Other mental disorders complicating pregnancy, first trimester: Secondary | ICD-10-CM | POA: Insufficient documentation

## 2022-09-14 DIAGNOSIS — F332 Major depressive disorder, recurrent severe without psychotic features: Secondary | ICD-10-CM | POA: Insufficient documentation

## 2022-09-14 DIAGNOSIS — Z79899 Other long term (current) drug therapy: Secondary | ICD-10-CM | POA: Diagnosis not present

## 2022-09-14 DIAGNOSIS — O99611 Diseases of the digestive system complicating pregnancy, first trimester: Secondary | ICD-10-CM | POA: Insufficient documentation

## 2022-09-14 DIAGNOSIS — Z3A13 13 weeks gestation of pregnancy: Secondary | ICD-10-CM | POA: Insufficient documentation

## 2022-09-14 DIAGNOSIS — O99891 Other specified diseases and conditions complicating pregnancy: Secondary | ICD-10-CM | POA: Insufficient documentation

## 2022-09-14 NOTE — Progress Notes (Signed)
Virtual Visit via Video Note   I connected with Patty Castillo on 09/14/22 at  9:00 AM EDT by a video enabled telemedicine application and verified that I am speaking with the correct person using two identifiers.   At orientation to the IOP program, Case Manager discussed the limitations of evaluation and management by telemedicine and the availability of in person appointments. The patient expressed understanding and agreed to proceed with virtual visits throughout the duration of the program.   Location:  Patient: Patient Home Provider: [facility]   History of Present Illness: MDD with anxious distress   Observations/Objective: Check In: Case Manager checked in with all participants to review discharge dates, insurance authorizations, work-related documents and needs from the treatment team regarding medications. Patty Castillo stated needs and engaged in discussion.    Initial Therapeutic Activity: Counselor facilitated a check-in with Patty Castillo to assess for safety, sobriety and medication compliance.  Counselor also inquired about Patty Castillo's current emotional ratings, as well as any significant changes in thoughts, feelings or behavior since previous check in.  Patty Castillo presented for session on time and was alert, oriented x5, with no evidence or self-report of active SI/HI or A/V H.  Patty Castillo reported compliance with medication and denied use of alcohol or illicit substances.  Patty Castillo reported scores of 5/10 for depression, 8/10 for anxiety, and 0/10 for anger/irritability.  Patty Castillo denied any recent outbursts.  Patty Castillo reported that a recent struggle has been dealing with increased stress related to being [redacted] weeks pregnant, in addition to parenting 2 other children.  She stated "Its like my brain spirals.  I've had depression and anxiety for years and I'm hoping therapy will help me figure out where its coming from".  Patty Castillo reported that her goal today is to take a shower or do some sewing.       Second  Therapeutic Activity: Counselor utilized a Cabin crew with group members today to guide discussion on topic of codependency.  This handout defined codependency as excessive emotional or psychological reliance upon someone who requires support on account of an illness or addiction.  It also explained how this issue presents in dysfunctional family systems, including behavior such as denying existence of problems, rigid boundaries on communication, strained trust, lack of individuality, and reinforcement of unhealthy coping mechanisms such as substance use.  Characteristics of co-dependent people were listed for assistance with identification, such as extreme need for approval/recognition, difficulty identifying feelings, poor communication, and more.  Members were also tasked with completing a questionnaire in order to identify signs of codependency and results were discussed afterward.  This handout also offered strategies for resolving co-dependency within one's network, including increased use of assertive communication skills in order to set appropriate boundaries.  Intervention was effective, as evidenced by Patty Castillo actively participating in discussion on the subject, and completing codependency questionnaire, with 6 out of 20 positive responses.  Patty Castillo reported that she grew up in a dysfunctional family which likely cultivated codependent traits.  Patty Castillo stated "My mom had mental problems.  I can still remember her getting into fights with my dad on the road and try to jump out of the car.  She had really, very strong reactions to things.  My dad had to call the police one time on her because she was acting irrationally".  She reported that her goal will be to work on improving communication skills during [facility], since sometimes it can be challenging to set healthy boundaries in friendships.  Assessment and Plan: Counselor recommends that Patty Castillo remain in IOP treatment to better manage mental health  symptoms, ensure stability and pursue completion of treatment plan goals. Counselor recommends adherence to crisis/safety plan, taking medications as prescribed, and following up with medical professionals if any issues arise.    Follow Up Instructions: Counselor will send Webex link for session tomorrow.  Patty Castillo was advised to call back or seek an in-person evaluation if the symptoms worsen or if the condition fails to improve as anticipated.   Collaboration of Care:   Medication Management AEB Dr. Eliseo Gum or Hillery Jacks, NP                                          Case Manager AEB Jeri Modena, CNA   Patient/Guardian was advised Release of Information must be obtained prior to any record release in order to collaborate their care with an outside provider. Patient/Guardian was advised if they have not already done so to contact the registration department to sign all necessary forms in order for [place] to release information regarding their care.   Consent: Patient/Guardian gives verbal consent for treatment and assignment of benefits for services provided during this visit. Patient/Guardian expressed understanding and agreed to proceed.  I provided 180 minutes of non-face-to-face time during this encounter.   Patty Castillo, [place], LCAS 09/14/22

## 2022-09-15 ENCOUNTER — Other Ambulatory Visit (HOSPITAL_COMMUNITY): Payer: Commercial Managed Care - PPO | Admitting: Licensed Clinical Social Worker

## 2022-09-15 DIAGNOSIS — F332 Major depressive disorder, recurrent severe without psychotic features: Secondary | ICD-10-CM

## 2022-09-15 NOTE — Progress Notes (Signed)
Virtual Visit via Video Note  I connected with Patty Castillo on 09/15/22 at  9:00 AM EDT by a video enabled telemedicine application and verified that I am speaking with the correct person using two identifiers.  Location: Patient: Home Provider: Office   I discussed the limitations of evaluation and management by telemedicine and the availability of in person appointments. The patient expressed understanding and agreed to proceed.   I discussed the assessment and treatment plan with the patient. The patient was provided an opportunity to ask questions and all were answered. The patient agreed with the plan and demonstrated an understanding of the instructions.   The patient was advised to call back or seek an in-person evaluation if the symptoms worsen or if the condition fails to improve as anticipated.     Bobbye Morton, MD   Psychiatric Initial Adult Assessment   Patient Identification: Patty Castillo MRN:  [id number] Date of Evaluation:  09/15/2022 Referral Source: ED Chief Complaint:   Chief Complaint  Patient presents with   Anxiety   Visit Diagnosis:    ICD-10-CM   1. Major depressive disorder, recurrent episode, severe with anxious distress  F33.2       History of Present Illness:  Patty Castillo is a 37 yo patient who is [redacted] weeks pregnant and has a PPH of MDD and GAD.   [redacted] weeks pregnant Reiceved Xanax in the ED-09/10/2022 she took 1 pill but felt it did not work and has not taken anymore  Patient reports that she is worried that her SSRI is "pooping out." Patient reports that she has noticed that when pregnant she tends to "spiral more." Patient reports that she feels like she will perseverate on bodily functions and her thoughts will spiral into catastrophizations that are extremely debilitating. She reports that she is frequently worrying and cannot control her worries. She endorses worrying about essentially everything. She induces panic  in herself. She endorses somatization of symptoms. She reports that she will diarrhea and stomach upset. She reports occasional panic attacks, where she will have tachypnea and can't function. She reports that she has a panic attack the weekend , and the number declined over the weekend.  Patient reports that by herself she is more open to exposed to these anxious thoughts.   Patient reports that in college she overly controlled what she ate, including protein. She reports being focused on losing weight, logging every calorie, what time and what she ate. She was never overweight but was underweight at times.   Patient reports that her anxiety does lead to decrease appetite, but she feels like she is overall healthy eating. She reports that thus far in her pregnancy she was having night time emesis and nausea. She was having migraines. So far  OB visits are good. Patient reports that her sleep is "ok." She reports that she has a fear of not sleeping, and if she is the only person awake she gets very anxious. She can't sleep next to her husband, so she does not feel pressure to fall asleep when he does. She reports that this is new.   She denies AVH and symptoms of psychosis. Patient denies SI but has had passive SI. She reports the passive SI was worse over the weekend but has decreased. Patient denies HI. Patient reports that she does not really have anhedonia, but she constantly feels jittery and anxious. Patient denies feeling overly worthless, hopeless, and guilty.   Patient denies symptoms of bipolar  d/o.  Patient denies hx of abuse. Patient reports that she grew up fearful because she was left with baby sitters who would just leave her in the home alone, so she was constantly afraid.     Associated Signs/Symptoms: Depression Symptoms:  depressed mood, fatigue, recurrent thoughts of death, anxiety, panic attacks, loss of energy/fatigue, (Hypo) Manic Symptoms:   denies Anxiety Symptoms:   Excessive Worry, Panic Symptoms, Specific Phobias, Psychotic Symptoms:   denies PTSD Symptoms: Negative  Past Psychiatric History:  Dx: MDD and GAD, hx of ED in college INPT: Denies [facility]: Dr. Gilmore Laroche Therapy: Idamae Schuller current DDU:[id number]  Past: Xanax (didn't feel it helped), Ativan, Valium (in college) been on Celexa since 2013 No hx of SA or self harm  Previous Psychotropic Medications: Yes   Substance Abuse History in the last 12 months:  No. Etoh: none THC: none, before and during pregnancy Cigarettes/ vape: none No other illicit substances  Consequences of Substance Abuse: NA  Past Medical History:  Past Medical History:  Diagnosis Date   Anxiety    Depression    Hx of varicella     Past Surgical History:  Procedure Laterality Date   NO PAST SURGERIES      Family Psychiatric History:  P Uncle: Bipolar P GMA: Anxiety and depression Mom would just jump out of the car (when her dad was driving) down the freeway, when upset. Mom would hit herself in front of them. Mom would go up on the roof and crawl through the window when patient was upset with her.   Family History:  Family History  Problem Relation Age of Onset   Cancer Mother        breast   Hypothyroidism Mother    Breast cancer Mother    Hypertension Father    Depression Sister    Hypothyroidism Maternal Aunt    Bipolar disorder Paternal Uncle    Diabetes Maternal Grandfather    Alzheimer's disease Maternal Grandmother    Cancer Paternal Grandfather    Anxiety disorder Paternal Grandmother    Mental illness Paternal Grandmother    Depression Paternal Grandmother    Cancer Paternal Grandmother        breast   Breast cancer Paternal Grandmother     Social History:   Social History   Socioeconomic History   Marital status: Married    Spouse name: Not on file   Number of children: 2   Years of education: Not on file   Highest education level: Bachelor's degree (e.g., BA, AB, BS)   Occupational History   Not on file  Tobacco Use   Smoking status: Never   Smokeless tobacco: Never  Vaping Use   Vaping Use: Never used  Substance and Sexual Activity   Alcohol use: No   Drug use: No   Sexual activity: Yes    Birth control/protection: None  Other Topics Concern   Not on file  Social History Narrative   Not on file   Social Determinants of Health   Financial Resource Strain: Not on file  Food Insecurity: Not on file  Transportation Needs: Not on file  Physical Activity: Not on file  Stress: Not on file  Social Connections: Not on file    Additional Social History:   - Lives with husband and 7 and 4 yo - 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9 - moved to [place] because he is from here - has friends - husband family is in [place] - her family is in [place]  Allergies:   Allergies  Allergen Reactions   Doxycycline Nausea And Vomiting   Latex Hives and Rash    Metabolic Disorder Labs: No results found for: "HGBA1C", "MPG" No results found for: "PROLACTIN" No results found for: "CHOL", "TRIG", "HDL", "CHOLHDL", "VLDL", "LDLCALC" No results found for: "TSH"  Therapeutic Level Labs: No results found for: "LITHIUM" No results found for: "CBMZ" No results found for: "VALPROATE"  Current Medications: Current Outpatient Medications  Medication Sig Dispense Refill   ALPRAZolam (XANAX) 0.25 MG tablet Take 1 tablet (0.25 mg total) by mouth as needed for anxiety. 10 tablet 0   citalopram (CELEXA) 40 MG tablet Take 1 tablet (40 mg total) by mouth every evening. 30 tablet 0   prenatal vitamin w/FE, FA (PRENATAL 1 + 1) 27-1 MG TABS tablet Take 1 tablet by mouth at bedtime.      traZODone (DESYREL) 50 MG tablet TAKE ONE TABLET BY MOUTH EVERY NIGHT AT BEDTIME AS NEEDED FOR SLEEP 30 tablet 0   No current facility-administered  medications for this visit.     Psychiatric Specialty Exam: Review of Systems  Psychiatric/Behavioral:  Positive for dysphoric mood. Negative for hallucinations and suicidal ideas. The patient is nervous/anxious.     currently breastfeeding.There is no height or weight on file to calculate BMI.  General Appearance: Casual  Eye Contact:  Good  Speech:  Clear and Coherent  Volume:  Normal  Mood:  Anxious  Affect:  Congruent  Thought Process:  Goal Directed  Orientation:  Full (Time, Place, and Person)  Thought Content:  Tangential and and peserverates  Suicidal Thoughts:  No  Homicidal Thoughts:  No  Memory:  Immediate;   Good Recent;   Good  Judgement:  Fair  Insight:  Fair  Psychomotor Activity:  NA  Concentration:  Concentration: Fair  Recall:  NA  Fund of Knowledge:Good  Language: Good  Akathisia:  NA  Handed:    AIMS (if indicated):  not done  Assets:  Communication Skills Desire for Improvement Housing Resilience Social Support Transportation Vocational/Educational  ADL's:  Intact  Cognition: WNL  Sleep:  Poor   Screenings: Oceanographer Row Counselor from 09/13/2022 in BEHAVIORAL HEALTH INTENSIVE PSYCH  PHQ-2 Total Score 6  PHQ-9 Total Score 27      Flowsheet Row Counselor from 09/13/2022 in BEHAVIORAL HEALTH INTENSIVE PSYCH Admission (Discharged) from 09/10/2022 in [facility] Maternity Assessment Unit Video Visit from 04/13/2022 in [name] at [facility]  C-SSRS RISK CATEGORY Error: Question 6 not populated Low Risk No Risk       Assessment and Plan:    Will see perinatal psych in 09/25/2021. Patient appears to have very severe GAD and could benefit from changing SSRI to another since she has not had any other medication trials. This is also recommended at there have been more opportunities to study pregnancy and SSRI's than perhaps antipsychotics. Would like to minimize the medications that fetus is  exposed to. Patient not sure she wants to come of Celexa right now for Zoloft, despite disucssion. She is aware that Zoloft is ok to use during pregnancy but is nervous about dysregulation on titration. She would like to think about.   GAD -Continue Celexa 40mg  daily - Hold Xanax  Collaboration of Care: PHP  Patient/Guardian was advised Release  of Information must be obtained prior to any record release in order to collaborate their care with an outside provider. Patient/Guardian was advised if they have not already done so to contact the registration department to sign all necessary forms in order for [place] to release information regarding their care.   Consent: Patient/Guardian gives verbal consent for treatment and assignment of benefits for services provided during this visit. Patient/Guardian expressed understanding and agreed to proceed.   PGY-3 Bobbye Morton, MD 4/18/20241:09 PM

## 2022-09-15 NOTE — Progress Notes (Signed)
Virtual Visit via Video Note   I connected with Becky Sax on 09/15/22 at  9:00 AM EDT by a video enabled telemedicine application and verified that I am speaking with the correct person using two identifiers.   At orientation to the IOP program, Case Manager discussed the limitations of evaluation and management by telemedicine and the availability of in person appointments. The patient expressed understanding and agreed to proceed with virtual visits throughout the duration of the program.   Location:  Patient: Patient Home Provider: [facility]   History of Present Illness: MDD with anxious distress  Observations/Objective: Check In: Case Manager checked in with all participants to review discharge dates, insurance authorizations, work-related documents and needs from the treatment team regarding medications. Iviona stated needs and engaged in discussion.    Initial Therapeutic Activity: Counselor facilitated a check-in with Rosey to assess for safety, sobriety and medication compliance.  Counselor also inquired about Ashlynne's current emotional ratings, as well as any significant changes in thoughts, feelings or behavior since previous check in.  Asra presented for session on time and was alert, oriented x5, with no evidence or self-report of active SI/HI or A/V H.  Ridley reported compliance with medication and denied use of alcohol or illicit substances.  Maudry reported scores of 2/10 for depression, 6/10 for anxiety, and 0/10 for anger/irritability.  Atara denied any recent outbursts or panic attacks.  Ramata reported that a recent success was making muffins with her son yesterday for self-care.   Tamatha reported that an ongoing struggle is dealing with anxiety, stating "I feel shaky all the time because of these thoughts, like worrying about people falling asleep before me".  Rosamaria reported that her goal today is to volunteer to help clean up a classroom at her daughter's  school.         Second Therapeutic Activity: Counselor introduced topic of self-esteem today and defined this as the value an individual places on oneself, based upon assessment of personal worth as a human being and approval/disapproval of one's behavior. Counselor asked members to assess their level of self-esteem at this time based upon common indicators of high self-esteem, including: accepting oneself unconditionally;  having self-respect and deep seated belief that one matters; being unaffected by other people's opinions/criticisms; and showing good control over emotions.  Counselor also explained concept of one's inner critic which serves to highlight faults and minimize strengths, directly influencing low sense of self-esteem.  Counselor then provided handout on 'strengths and qualities', which featured questions to guide discussion and increase awareness of each member's unique individual abilities which could reinforce higher self-esteem. Examples of questions included: 'things I am good at', 'challenges I have overcome', and 'what I like about myself'.  Intervention was effective, as evidenced by [name] actively engaging in discussion on topic, and completing a self-esteem assessment, receiving a score of 15, which indicated a 'moderate' level of self-esteem at this time.  Taneeka stated "I actually thought my score would be lower.  Most of the time I don't consider myself a positive person but I take a lot of pride in things I do well, like parenting.  It gives me confidence to see them treating others well".  Cedra was receptive to several strategies offered today for increasing self-esteem during treatment, including building up her support network to include more positive people that uplift her like her husband does, developing assertive communication skills so she can set healthier boundaries, making plans for the future to look  forward to such as attending an event at her children's school which  will require her to buy a nice dress, and reducing overall comparison to other people through social media.  Assessment and Plan: Counselor recommends that Kitzia remain in IOP treatment to better manage mental health symptoms, ensure stability and pursue completion of treatment plan goals. Counselor recommends adherence to crisis/safety plan, taking medications as prescribed, and following up with medical professionals if any issues arise.    Follow Up Instructions: Counselor will send Webex link for session tomorrow.  Darla was advised to call back or seek an in-person evaluation if the symptoms worsen or if the condition fails to improve as anticipated.   Collaboration of Care:   Medication Management AEB Dr. Eliseo Gum or Hillery Jacks, NP                                          Case Manager AEB Jeri Modena, CNA   Patient/Guardian was advised Release of Information must be obtained prior to any record release in order to collaborate their care with an outside provider. Patient/Guardian was advised if they have not already done so to contact the registration department to sign all necessary forms in order for [place] to release information regarding their care.   Consent: Patient/Guardian gives verbal consent for treatment and assignment of benefits for services provided during this visit. Patient/Guardian expressed understanding and agreed to proceed.  I provided 180 minutes of non-face-to-face time during this encounter.   Noralee Stain, [place], LCAS 09/15/22

## 2022-09-16 ENCOUNTER — Other Ambulatory Visit (HOSPITAL_COMMUNITY): Payer: Commercial Managed Care - PPO | Admitting: Psychiatry

## 2022-09-16 DIAGNOSIS — F332 Major depressive disorder, recurrent severe without psychotic features: Secondary | ICD-10-CM | POA: Diagnosis not present

## 2022-09-16 NOTE — Progress Notes (Signed)
Virtual Visit via Video Note   I connected with Patty Castillo on 09/16/22 at  9:00 AM EDT by a video enabled telemedicine application and verified that I am speaking with the correct person using two identifiers.   At orientation to the IOP program, Case Manager discussed the limitations of evaluation and management by telemedicine and the availability of in person appointments. The patient expressed understanding and agreed to proceed with virtual visits throughout the duration of the program.   Location:  Patient: Patient Home Provider: [facility]   History of Present Illness: MDD with anxious distress   Observations/Objective: Check In: Case Manager checked in with all participants to review discharge dates, insurance authorizations, work-related documents and needs from the treatment team regarding medications. Patty Castillo stated needs and engaged in discussion.    Initial Therapeutic Activity: Counselor facilitated a check-in with Patty Castillo to assess for safety, sobriety and medication compliance.  Counselor also inquired about Patty Castillo's current emotional ratings, as well as any significant changes in thoughts, feelings or behavior since previous check in.  Patty Castillo presented for session on time and was alert, oriented x5, with no evidence or self-report of active SI/HI or A/V H.  Patty Castillo reported compliance with medication and denied use of alcohol or illicit substances.  Patty Castillo reported scores of 1/10 for depression, 5/10 for anxiety, and 0/10 for anger/irritability.  Patty Castillo denied any recent outbursts or panic attacks.  Patty Castillo reported that a recent success was taking a shower after group yesterday and putting on makeup today.  Patty Castillo reported that an ongoing struggle is handling anxiety throughout the day which can spike unexpectedly.  Patty Castillo reported that her goal this weekend is to spend time with her mother, who is recovering from cancer.         Second Therapeutic Activity: Counselor  introduced topic of creating mental health maintenance plan today.  Counselor provided handout on subject to members, which stressed the importance of maintaining one's mental health in a similar way to using diet and exercise to ensure physical health.  Counselor walked members through process of identifying triggers which could worsen symptoms, including specific people, places, and things one needs to avoid.  Members were also tasked with identifying warning signs such as thoughts, feelings, or behaviors which could indicate mental health is at increased risk.  Counselor also facilitated conversation on self-care activities and coping strategies which members have previously utilized in the past, are currently using in daily routine, or plan to use soon to assist with managing problems or symptoms when/if they appear.  Counselor encouraged members to revisit their maintenance plan often and make changes as needed to ensure day to day stability.  Intervention was effective, as evidenced by Patty Castillo participating in activity and creating a comprehensive plan, including identification of triggers such as hyperfocusing on her breathing or sleep patterns, and warning signs including experiencing panic attacks, feeling overheated or panicky, stating "My whole body reacts physically when things get overwhelming".  Patty Castillo also reported that she would make an effort to set aside time for self-care activities such as taking showers and putting on makeup more often to improve hygiene and use coping skills such as practicing deep breathing, writing in her journal, snapping a rubberband on her wrist to ground herself when experiencing irrational thoughts, or communicating her thoughts or needs assertively to a positive support to manage stressors.    Third Therapeutic Activity: Psycho-educational portion of group was provided by Patty Castillo, Interior and spatial designer of community education with [employer].  Patty Castillo provided  information on history of her local agency, mission statement, and the variety of unique services offered which group members might find beneficial to engage in, including both virtual and in-person support groups, as well as peer support program for mentoring.  Patty Castillo offered time to answer member's questions regarding services and encouraged them to consider utilizing these services to assist in working towards their individual wellness goals.  Intervention was effective, as evidenced by Patty Castillo participating in discussion with speaker on the subject, reporting that she would be interested in seeking admission to some of the support groups after [facility] concludes depending on how many participants might be present.    Assessment and Plan: Counselor recommends that Patty Castillo remain in IOP treatment to better manage mental health symptoms, ensure stability and pursue completion of treatment plan goals. Counselor recommends adherence to crisis/safety plan, taking medications as prescribed, and following up with medical professionals if any issues arise.    Follow Up Instructions: Counselor will send Webex link for session tomorrow.  Patty Castillo was advised to call back or seek an in-person evaluation if the symptoms worsen or if the condition fails to improve as anticipated.   Collaboration of Care:   Medication Management AEB Dr. Eliseo Gum or Hillery Jacks, NP                                          Case Manager AEB Jeri Modena, CNA   Patient/Guardian was advised Release of Information must be obtained prior to any record release in order to collaborate their care with an outside provider. Patient/Guardian was advised if they have not already done so to contact the registration department to sign all necessary forms in order for [place] to release information regarding their care.   Consent: Patient/Guardian gives verbal consent for treatment and assignment of benefits for services provided during this visit.  Patient/Guardian expressed understanding and agreed to proceed.  I provided 180 minutes of non-face-to-face time during this encounter.   Noralee Stain, [place], LCAS 09/16/22

## 2022-09-19 ENCOUNTER — Other Ambulatory Visit (HOSPITAL_COMMUNITY): Payer: Commercial Managed Care - PPO | Admitting: Licensed Clinical Social Worker

## 2022-09-19 DIAGNOSIS — F332 Major depressive disorder, recurrent severe without psychotic features: Secondary | ICD-10-CM

## 2022-09-19 NOTE — Progress Notes (Signed)
Virtual Visit via Video Note   I connected with Patty Castillo on 09/19/22 at  9:00 AM EDT by a video enabled telemedicine application and verified that I am speaking with the correct person using two identifiers.   At orientation to the IOP program, Case Manager discussed the limitations of evaluation and management by telemedicine and the availability of in person appointments. The patient expressed understanding and agreed to proceed with virtual visits throughout the duration of the program.   Location:  Patient: Patient Home Provider: [facility]   History of Present Illness: MDD with anxious distress  Observations/Objective: Check In: Case Manager checked in with all participants to review discharge dates, insurance authorizations, work-related documents and needs from the treatment team regarding medications. Patty Castillo stated needs and engaged in discussion.    Initial Therapeutic Activity: Counselor facilitated a check-in with Patty Castillo to assess for safety, sobriety and medication compliance.  Counselor also inquired about Patty Castillo's current emotional ratings, as well as any significant changes in thoughts, feelings or behavior since previous check in.  Patty Castillo presented for session on time and was alert, oriented x5, with no evidence or self-report of active SI/HI or A/V H.  Patty Castillo reported compliance with medication and denied use of alcohol or illicit substances.  Patty Castillo reported scores of 1/10 for depression, 4/10 for anxiety, and 2/10 for irritability.  Patty Castillo denied any recent outbursts or panic attacks.  Patty Castillo reported that a recent success was spending time with her mother over the weekend, including working on a TEFL teacher.  Patty Castillo reported that an additional success was being able to sleep next to her husband with minimal anxiety last night.  Patty Castillo denied any new struggles at this time.  Patty Castillo reported that her goal today is to attend a doctor's appointment, take her son to a class,  and then go shopping for maternity clothes.         Second Therapeutic Activity: Counselor covered topic of core beliefs with group today.  Counselor virtually shared a handout on the subject, which explained how everyone looks at the world differently, and two people can have the same experience, but have different interpretations of what happened.  Members were encouraged to think of these like sunglasses with different "shades" influencing perception towards positive or negative outcomes.  Examples of negative core beliefs were provided, such as "I'm unlovable", "I'm not good enough", and "I'm a bad person".  Members were asked to share which one(s) they could relate to, and then identify evidence which contradicts these beliefs.  Counselor also provided psychoeducation on positive affirmations today.  Counselor explained how these are positive statements which can be spoken out loud or recited mentally to challenge negative thoughts and/or core beliefs to improve mood and outlook each day.  Counselor shared a comprehensive list of affirmations virtually to members with different categories, including ones for health, confidence, success, and happiness.  Counselor invited members to look through this list and identify any which resonated with them, and practice saying them out loud with sincerity.  Intervention was effective, as evidenced by Patty Castillo successfully participating in discussion on the subject and reporting that she could relate to several negative core beliefs listed on the handout, such as "I am weak", "I am a loser", "I am unlovable", "The world is dangerous", and "I am trapped".  Patty Castillo was able to successfully challenge the core belief "I am weak" by listing evidence that contradicted it, reporting that she has been able to forgive people, handle  challenging situations in her past, and still move forward with her life despite this.  Patty Castillo also reported that she liked several of the positive  affirmations listed, such as "I love myself deeply and completely", "The mistakes I made yesterday are creating the person I'll be tomorrow", and "All my problems have solutions".    Assessment and Plan: Counselor recommends that Patty Castillo remain in IOP treatment to better manage mental health symptoms, ensure stability and pursue completion of treatment plan goals. Counselor recommends adherence to crisis/safety plan, taking medications as prescribed, and following up with medical professionals if any issues arise.    Follow Up Instructions: Counselor will send Webex link for session tomorrow.  Patty Castillo was advised to call back or seek an in-person evaluation if the symptoms worsen or if the condition fails to improve as anticipated.   Collaboration of Care:   Medication Management AEB Dr. Eliseo Gum or Hillery Jacks, NP                                          Case Manager AEB Jeri Modena, CNA   Patient/Guardian was advised Release of Information must be obtained prior to any record release in order to collaborate their care with an outside provider. Patient/Guardian was advised if they have not already done so to contact the registration department to sign all necessary forms in order for [place] to release information regarding their care.   Consent: Patient/Guardian gives verbal consent for treatment and assignment of benefits for services provided during this visit. Patient/Guardian expressed understanding and agreed to proceed.  I provided 180 minutes of non-face-to-face time during this encounter.   Patty Castillo, [place], LCAS 09/19/22

## 2022-09-20 ENCOUNTER — Other Ambulatory Visit (HOSPITAL_COMMUNITY): Payer: Commercial Managed Care - PPO | Admitting: Licensed Clinical Social Worker

## 2022-09-20 DIAGNOSIS — F332 Major depressive disorder, recurrent severe without psychotic features: Secondary | ICD-10-CM

## 2022-09-20 NOTE — Progress Notes (Signed)
Virtual Visit via Video Note   I connected with Patty Castillo on 09/20/22 at  9:00 AM EDT by a video enabled telemedicine application and verified that I am speaking with the correct person using two identifiers.   At orientation to the IOP program, Case Manager discussed the limitations of evaluation and management by telemedicine and the availability of in person appointments. The patient expressed understanding and agreed to proceed with virtual visits throughout the duration of the program.   Location:  Patient: Patient Home Provider: [facility]   History of Present Illness: MDD with anxious distress   Observations/Objective: Check In: Case Manager checked in with all participants to review discharge dates, insurance authorizations, work-related documents and needs from the treatment team regarding medications. Patty Castillo stated needs and engaged in discussion.    Initial Therapeutic Activity: Counselor facilitated a check-in with Patty Castillo to assess for safety, sobriety and medication compliance.  Counselor also inquired about Patty Castillo's current emotional ratings, as well as any significant changes in thoughts, feelings or behavior since previous check in.  Patty Castillo presented for session on time and was alert, oriented x5, with no evidence or self-report of active SI/HI or A/V H.  Patty Castillo reported compliance with medication and denied use of alcohol or illicit substances.  Patty Castillo reported scores of 1/10 for depression, 3/10 for anxiety, and 0/10 for anger/irritability.  Patty Castillo denied any recent outbursts or panic attacks.  Patty Castillo reported that a recent success was taking her son to [employer], and then taking a walk in the park together.  Patty Castillo denied any new struggles.  Patty Castillo reported that her goal today is to run some errands with her kids, but set aside time for self-care.       Second Therapeutic Activity: Counselor introduced [name], [place] Chaplain to provide psychoeducation on  topic of Grief and Loss with members today.  Patty Castillo began discussion by checking in with the group about their baseline mood today, general thoughts on what grief means to them and how it has affected them personally in the past.  Patty Castillo provided information on how the process of grief/loss can differ depending upon one's unique culture, and categories of loss one could experience (i.e. loss of a person, animal, relationship, job, identity, etc).  Patty Castillo encouraged members to be mindful of how pervasive loss can be, and how to recognize signs which could indicate that this is having an impact on one's overall mental health and wellbeing.  Intervention was effective, as evidenced by Patty Castillo participating in discussion with speaker on the subject, reporting that since she started to struggle more with depression and anxiety, there will be moments throughout the day where she grieves the loss of normalcy, stating "I'll look around the house and see a quilt or something I was able to make, and cry because I'm grieving that time when I was okay, but can't accomplish these things now".  Patty Castillo was receptive to supportive feedback from chaplain, and encouragement to stay engaged in therapy in order to reach a state of stability again where she can enjoy these self-care activities.     Third Therapeutic Activity: Counselor invited members to participate in peaceful place guided imagery activity today.  Counselor explained how this is a powerful visualization tool which can aid in reducing stress while increasing sense of calm, control, and awareness if practiced regularly.  Counselor informed members beforehand that if they became uncomfortable at any point during activity, they could stop and open their eyes.  Counselor  invited members to get comfortable, achieve a relaxing breathing rhythm, close their eyes, and then guided them through process of creating a 'peaceful place' which filled them with safety and calm.   Counselor encouraged members to include sensory details involving vision, sound, touch, smell, and taste which they considered pleasant to enhance experience.  After 10 minutes of practice in session, counselor invited members to share their opinion on the activity, including whether they were able to imagine a specific place, what details stood out to them, and how this made them feel during and after.  Intervention was effective, as evidenced by Patty Castillo successfully participating in activity, and reporting that she was able to imagine herself at a [place] in the mountains during the Christmas season.  She reported that she was sitting by a warm fire in a blanket with her favorite drink and could feel the heat on her skin and taste the drunk.  She reported that she would be interested in adding this to her coping skills, stating "I've done this in therapy before and it can be hard to figure out at first, but now I have a designated place".    Assessment and Plan: Counselor recommends that Patty Castillo remain in IOP treatment to better manage mental health symptoms, ensure stability and pursue completion of treatment plan goals. Counselor recommends adherence to crisis/safety plan, taking medications as prescribed, and following up with medical professionals if any issues arise.    Follow Up Instructions: Counselor will send Webex link for session tomorrow.  Patty Castillo was advised to call back or seek an in-person evaluation if the symptoms worsen or if the condition fails to improve as anticipated.   Collaboration of Care:   Medication Management AEB Dr. Eliseo Gum or Hillery Jacks, NP                                          Case Manager AEB Jeri Modena, CNA   Patient/Guardian was advised Release of Information must be obtained prior to any record release in order to collaborate their care with an outside provider. Patient/Guardian was advised if they have not already done so to contact the registration department  to sign all necessary forms in order for [place] to release information regarding their care.   Consent: Patient/Guardian gives verbal consent for treatment and assignment of benefits for services provided during this visit. Patient/Guardian expressed understanding and agreed to proceed.  I provided 180 minutes of non-face-to-face time during this encounter.   Noralee Stain, [place], LCAS 09/20/22

## 2022-09-21 ENCOUNTER — Other Ambulatory Visit (HOSPITAL_COMMUNITY): Payer: Commercial Managed Care - PPO | Admitting: Licensed Clinical Social Worker

## 2022-09-21 DIAGNOSIS — F332 Major depressive disorder, recurrent severe without psychotic features: Secondary | ICD-10-CM

## 2022-09-21 NOTE — Progress Notes (Signed)
Virtual Visit via Video Note   I connected with Patty Castillo on 09/21/22 at  9:00 AM EDT by a video enabled telemedicine application and verified that I am speaking with the correct person using two identifiers.   At orientation to the IOP program, Case Manager discussed the limitations of evaluation and management by telemedicine and the availability of in person appointments. The patient expressed understanding and agreed to proceed with virtual visits throughout the duration of the program.   Location:  Patient: [facility] Home Provider: [facility]   History of Present Illness: MDD with anxious distress   Observations/Objective: Check In: Case Manager checked in with all participants to review discharge dates, insurance authorizations, work-related documents and needs from the treatment team regarding medications. [name] stated needs and engaged in discussion.    Initial Therapeutic Activity: Counselor facilitated a check-in with [name] to assess for safety, sobriety and medication compliance.  Counselor also inquired about [name]'s current emotional ratings, as well as any significant changes in thoughts, feelings or behavior since previous check in.  [name] presented for session on time and was alert, oriented x5, with no evidence or self-report of active SI/HI or A/V H.  [name] reported compliance with medication and denied use of alcohol or illicit substances.  [name] reported scores of 0/10 for depression, 3/10 for anxiety, and 0/10 for anger/irritability.  [name] denied any recent outbursts or panic attacks.  [name] reported that a recent success has been preoccupying her time, which has led to a reduction in overall symptoms.  [name] reported that a recent struggle was feeling overwhelmed with tasks yesterday, stating "It was a whirlwind.  I had a lot of distractions".  [name] reported that her goal today is to prepare for a [employer] event tomorrow with her family, stating "Its been a long  time since I had a chance to dress up".         Second Therapeutic Activity: Counselor introduced [name], [employer] Pharmacist, to provide psychoeducation on topic of medication compliance with members today.  [name] provided psychoeducation on classes of medications such as antidepressants, antipsychotics, what symptoms they are intended to treat, and any side effects one might encounter while on a particular prescription.  Time was allowed for clients to ask any questions they might have of [facility] regarding this specialty.  Intervention was effective, as evidenced by [name] participating in discussion with speaker on the subject, reporting that recently her doctor added Lamotrigine to her regimen to compliment her Celexa in addressing symptoms, but she is anxious about whether there could be complications due to her pregnancy.  [name] was receptive to feedback from pharmacist on benefits and potential side effects, as well as alternatives that could be discussed with her doctor.    Third Therapeutic Activity: Counselor introduced [name], [employer], to provide psychoeducation on topic of nutrition with members today.  [name] virtually shared a comprehensive PowerPoint presentation to guide discussion, which featured the various components of healthy living that influence one's wellbeing, including practicing mindfulness, staying physically active, calm in mood, well-rested, and more.  [name] explained how good nutrition reinforces positive physical and mental health, and shared a video which explained how food intake affects brain functioning in particular.  [name] provided advice on how to adjust diet in order to promote wellbeing during course of treatment, including achieving balanced daily intake along with regular exercise.  [name] offered the 'plate method' as a tool for proper distribution of protein, grains, starches, vegetables, fruit, and  low calorie drink, in addition to concept of 'mindful  eating', and covering current Dietary Guidelines for Americans for more tips.  [name] inquired about changes members would like to make to their nutrition in order to increase overall wellbeing based upon information shared today, and time was allowed to ask any questions they might have of [name] regarding her specialty.  Intervention was effective, as evidenced by [name] participating in discussion with speaker on the subject, reporting that this material made her think more carefully about modifying her diet to improve overall mood.  [name] stated "This is good to think about.  Its not always your brain's fault you're feeling down, it could be what you're eating".  [name] also participated in a stretching activity with guidance from speaker to increase physical activity/exercise.    Assessment and Plan: Counselor recommends that [name] remain in IOP treatment to better manage mental health symptoms, ensure stability and pursue completion of treatment plan goals. Counselor recommends adherence to crisis/safety plan, taking medications as prescribed, and following up with medical professionals if any issues arise.    Follow Up Instructions: Counselor will send Webex link for session tomorrow.  [name] was advised to call back or seek an in-person evaluation if the symptoms worsen or if the condition fails to improve as anticipated.   Collaboration of Care:   Medication Management AEB Dr. Eliseo Gum or Hillery Jacks, NP                                          Case Manager AEB Jeri Modena, CNA   Patient/Guardian was advised Release of Information must be obtained prior to any record release in order to collaborate their care with an outside provider. Patient/Guardian was advised if they have not already done so to contact the registration department to sign all necessary forms in order for [place] to release information regarding their care.   Consent: Patient/Guardian gives verbal consent for treatment and  assignment of benefits for services provided during this visit. Patient/Guardian expressed understanding and agreed to proceed.  I provided 180 minutes of non-face-to-face time during this encounter.   Noralee Stain, [place], LCAS 09/21/22

## 2022-09-22 ENCOUNTER — Other Ambulatory Visit (HOSPITAL_COMMUNITY): Payer: Commercial Managed Care - PPO | Admitting: Licensed Clinical Social Worker

## 2022-09-22 DIAGNOSIS — F332 Major depressive disorder, recurrent severe without psychotic features: Secondary | ICD-10-CM | POA: Diagnosis not present

## 2022-09-22 NOTE — Progress Notes (Signed)
Virtual Visit via Video Note   I connected with Patty Becky Sax4/25/24 at  9:00 AM EDT by a video enabled telemedicine application and verified that I am speaking with the correct person using two identifiers.   At orientation to the IOP program, Case Manager discussed the limitations of evaluation and management by telemedicine and the availability of in person appointments. The patient expressed understanding and agreed to proceed with virtual visits throughout the duration of the program.   Location:  Patient: [facility] Home Provider: [facility]   History of Present Illness: MDD with anxious distress  Observations/Objective: Check In: Case Manager checked in with all participants to review discharge dates, insurance authorizations, work-related documents and needs from the treatment team regarding medications. Patty Castillo stated needs and engaged in discussion.    Initial Therapeutic Activity: Counselor facilitated a check-in with Patty Castillo to assess for safety, sobriety and medication compliance.  Counselor also inquired about Patty Castillo's current emotional ratings, as well as any significant changes in thoughts, feelings or behavior since previous check in.  Patty Castillo presented for session on time and was alert, oriented x5, with no evidence or self-report of active SI/HI or A/V H.  Patty Castillo reported compliance with medication and denied use of alcohol or illicit substances.  Patty Castillo reported scores of 1/10 for depression, 3/10 for anxiety, and 0/10 for anger/irritability.  Patty Castillo denied any recent outbursts or panic attacks.  Patty Castillo reported that a recent success was taking her daughter to dance class after group yesterday, and working on her quilt for self-care.  Patty Castillo denied any new struggles.  Patty Castillo reported that her goal today is to attend a gala for her children's school, which will allow her to dress up.         Second Therapeutic Activity: Counselor covered topic of attachment styles today.   Counselor virtually shared a handout with the group on this topic which defined attachment styles as how people think about and behave in relationships.  Styles were broken down by category, including secure attachment where one believes close relationships are trustworthy, compared to insecure attachment (i.e. anxious, avoidant, or anxious-avoidant) where one is distrusting or worries about their bond with others.  Counselor inquired about which attachment style members most related to, how this has influenced their mental health/well-being, and whether they intend to begin making any changes.  Intervention was effective, as evidenced by Patty Castillo participating in discussion, and reporting that she most identified with the secure attachment style due to traits such as being committed to her partner, showing affection and attention to him, and ability to handle conflict well.  Patty Castillo reported that when she was growing up her parents were insecurely attached, so she made an effort as an adult to act counter to this behavior so that she would be a healthy role model to her children.  Patty Castillo reported that her goal is to start having nightly check-ins with her husband in order to ensure that communication remains clear, they are supportive of each other, and collectively tackle mutual problems as a team.    Assessment and Plan: Counselor recommends that Patty Castillo remain in IOP treatment to better manage mental health symptoms, ensure stability and pursue completion of treatment plan goals. Counselor recommends adherence to crisis/safety plan, taking medications as prescribed, and following up with medical professionals if any issues arise.    Follow Up Instructions: Counselor will send Webex link for session tomorrow.  Patty Castillo was advised to call back or seek an in-person evaluation if  the symptoms worsen or if the condition fails to improve as anticipated.   Collaboration of Care:   Medication Management AEB Dr. Eliseo Gum or Hillery Jacks, NP                                          Case Manager AEB Jeri Modena, CNA   Patient/Guardian was advised Release of Information must be obtained prior to any record release in order to collaborate their care with an outside provider. Patient/Guardian was advised if they have not already done so to contact the registration department to sign all necessary forms in order for [place] to release information regarding their care.   Consent: Patient/Guardian gives verbal consent for treatment and assignment of benefits for services provided during this visit. Patient/Guardian expressed understanding and agreed to proceed.  I provided 180 minutes of non-face-to-face time during this encounter.   Noralee Stain, [place], LCAS 09/22/22

## 2022-09-23 ENCOUNTER — Other Ambulatory Visit (HOSPITAL_COMMUNITY): Payer: Commercial Managed Care - PPO | Admitting: Licensed Clinical Social Worker

## 2022-09-23 DIAGNOSIS — F332 Major depressive disorder, recurrent severe without psychotic features: Secondary | ICD-10-CM | POA: Diagnosis not present

## 2022-09-23 NOTE — Progress Notes (Signed)
Virtual Visit via Video Note   I connected with Patty Castillo on 09/23/22 at  9:00 AM EDT by a video enabled telemedicine application and verified that I am speaking with the correct person using two identifiers.   At orientation to the IOP program, Case Manager discussed the limitations of evaluation and management by telemedicine and the availability of in person appointments. The patient expressed understanding and agreed to proceed with virtual visits throughout the duration of the program.   Location:  Patient: [facility] Home [facility]: [facility]   History of Present Illness: MDD with anxious distress  Observations/Objective: Check In: Case Manager checked in with all participants to review discharge dates, insurance authorizations, work-related documents and needs from the treatment team regarding medications. Patty Castillo stated needs and engaged in discussion.    Initial Therapeutic Activity: Counselor facilitated a check-in with Patty Castillo to assess for safety, sobriety and medication compliance.  Counselor also inquired about Patty Castillo's current emotional ratings, as well as any significant changes in thoughts, feelings or behavior since previous check in.  Patty Castillo presented for session on time and was alert, oriented x5, with no evidence or self-report of active SI/HI or A/V H.  Patty Castillo reported compliance with medication and denied use of alcohol or illicit substances.  Patty Castillo reported scores of 0/10 for depression, 3/10 for anxiety, and 0/10 for anger/irritability.  Patty Castillo denied any recent outbursts or panic attacks.  Patty Castillo reported that a recent success was enjoying the [place] she attended last night, stating "I haven't dressed up since my wedding".  She reported that she also won ballroom dancing classes at an auction, which she is looking forward to.  Patty Castillo denied any new struggles at this time.  Patty Castillo reported that her goal today is to do some birthday planning for her son, stating "That's  something I enjoy".       Second Therapeutic Activity: Counselor introduced topic of building a social support network today.  Counselor explained how this can be defined as having a having a group of healthy people in one's life you can talk to, spend time with, and get help from to improve both mental and physical health.  Counselor noted that some barriers can make it difficult to connect with other people, including the presence of anxiety or depression, or moving to an unfamiliar area.  Group members were asked to assess the current state of their support network, and identify ways that this could be improved.  Tips were given on how to address previously noted barriers, such as strengthening social skills, using relaxation techniques to reduce anxiety, scheduling social time each week, and/or exploring social events nearby which could increase chances of meeting new supports.  Members were also encouraged to consider getting closer to people they already know through suggestions such as outreaching someone by text, email or phone call if they haven't spoken in awhile, doing something nice for a friend/family member unexpectedly, and/or inviting someone over for a game/movie/dinner night.  Intervention was effective, as evidenced by Patty Castillo actively participating in discussion on the subject, and reporting that she faced a significant challenge when she moved to [place] from [place] 10 years ago, as this greatly affected her available support.  Patty Castillo stated "I was a newlywed and got pregnant right away.  I had no family or anything around".  She reported that connecting with supports in her church and neighborhood helped increase socialization, however it can be difficult to open up honestly about her feelings, or how people  can help her when she is severely depressed or anxious.  Patty Castillo reported that she hopes by staying engaged in therapy, she can become more honest and open about her feelings without fear of  judgement from those around her.  Patty Castillo stated "I need to remind myself that its okay to be a total mess".       Assessment and Plan: Counselor recommends that Kang remain in IOP treatment to better manage mental health symptoms, ensure stability and pursue completion of treatment plan goals. Counselor recommends adherence to crisis/safety plan, taking medications as prescribed, and following up with medical professionals if any issues arise.    Follow Up Instructions: Counselor will send Webex link for session tomorrow.  Patty Castillo was advised to call back or seek an in-person evaluation if the symptoms worsen or if the condition fails to improve as anticipated.   Collaboration of Care:   Medication Management AEB Dr. Eliseo Gum or Hillery Jacks, NP                                          Case Manager AEB Jeri Modena, CNA   Patient/Guardian was advised Release of Information must be obtained prior to any record release in order to collaborate their care with an outside provider. Patient/Guardian was advised if they have not already done so to contact the registration department to sign all necessary forms in order for [place] to release information regarding their care.   Consent: Patient/Guardian gives verbal consent for treatment and assignment of benefits for services provided during this visit. Patient/Guardian expressed understanding and agreed to proceed.  I provided 180 minutes of non-face-to-face time during this encounter.   Noralee Stain, [place], LCAS 09/23/22

## 2022-09-26 ENCOUNTER — Telehealth (HOSPITAL_COMMUNITY): Payer: Commercial Managed Care - PPO | Admitting: Psychiatry

## 2022-09-26 ENCOUNTER — Ambulatory Visit (HOSPITAL_COMMUNITY): Payer: Commercial Managed Care - PPO

## 2022-09-27 ENCOUNTER — Other Ambulatory Visit (HOSPITAL_COMMUNITY): Payer: Commercial Managed Care - PPO | Admitting: Licensed Clinical Social Worker

## 2022-09-27 DIAGNOSIS — F332 Major depressive disorder, recurrent severe without psychotic features: Secondary | ICD-10-CM | POA: Diagnosis not present

## 2022-09-27 NOTE — Progress Notes (Signed)
Virtual Visit via Video Note   I connected with Patty Castillo on 09/27/22 at  9:00 AM EDT by a video enabled telemedicine application and verified that I am speaking with the correct person using two identifiers.   At orientation to the IOP program, Case Manager discussed the limitations of evaluation and management by telemedicine and the availability of in person appointments. The patient expressed understanding and agreed to proceed with virtual visits throughout the duration of the program.   Location:  Patient: Patient Home [facility]: [facility]   History of Present Illness: MDD with anxious distress   Observations/Objective: Check In: Case Manager checked in with all participants to review discharge dates, insurance authorizations, work-related documents and needs from the treatment team regarding medications. Patty Castillo stated needs and engaged in discussion.    Initial Therapeutic Activity: Counselor facilitated a check-in with Patty Castillo to assess for safety, sobriety and medication compliance.  Counselor also inquired about Patty Castillo's current emotional ratings, as well as any significant changes in thoughts, feelings or behavior since previous check in.  Patty Castillo presented for session on time and was alert, oriented x5, with no evidence or self-report of active SI/HI or A/V H.  Patty Castillo reported compliance with medication and denied use of alcohol or illicit substances.  Patty Castillo reported scores of 0/10 for depression, 4/10 for anxiety, and 0/10 for anger/irritability.  Patty Castillo denied any recent outbursts or panic attacks.  Patty Castillo reported that a recent success was attending an OBGYN appointment which went well, and getting groceries yesterday.  Patty Castillo reported that a current struggle is dealing with anxiety today, stating "Its like I wake up and I'm already jittery".  Patty Castillo reported that her goal today is to take ice cream to her son's class.       Second Therapeutic Activity: Counselor  continued discussion upon topic of self-care today with group members.  Counselor virtually shared self-care assessment form with members via Webex to complete final sub-categories (i.e. social, spiritual, and professional).  Members were asked to rank their engagement in the activities listed for each dimension on a scale of 1-3, with 1 indicating 'Poor', 2 indicating 'Ok', and 3 indicating 'Well'.  Counselor invited members to share results of this assessment, and inquired about which areas of self-care they are doing well in, as well as areas that require attention, and how they plan to begin addressing this during treatment.  Intervention was effective, as evidenced by Patty Castillo successfully completing final sections of assessment and actively engaging in discussion on subject, reporting that she is excelling in areas such as spending time with people she likes, having stimulating conversations, asking others for help when needed, and doing enjoyable activities with friends, but would benefit from focusing more on areas such as spending time alone with her husband.  Patty Castillo reported that she would work to improve self-care deficits by setting aside more quality time in the weekly schedule with her husband to strengthen their connection/relationship.    Assessment and Plan: Counselor recommends that Patty Castillo remain in IOP treatment to better manage mental health symptoms, ensure stability and pursue completion of treatment plan goals. Counselor recommends adherence to crisis/safety plan, taking medications as prescribed, and following up with medical professionals if any issues arise.    Follow Up Instructions: Counselor will send Webex link for session tomorrow.  Patty Castillo was advised to call back or seek an in-person evaluation if the symptoms worsen or if the condition fails to improve as anticipated.   Collaboration of Care:  Medication Management AEB Dr. Eliseo Gum or Hillery Jacks, NP                                           Case Manager AEB Jeri Modena, CNA   Patient/Guardian was advised Release of Information must be obtained prior to any record release in order to collaborate their care with an outside provider. Patient/Guardian was advised if they have not already done so to contact the registration department to sign all necessary forms in order for [place] to release information regarding their care.   Consent: Patient/Guardian gives verbal consent for treatment and assignment of benefits for services provided during this visit. Patient/Guardian expressed understanding and agreed to proceed.  I provided 180 minutes of non-face-to-face time during this encounter.   Noralee Stain, [place], LCAS 09/27/22

## 2022-09-28 ENCOUNTER — Other Ambulatory Visit (HOSPITAL_COMMUNITY)
Payer: Commercial Managed Care - PPO | Attending: Licensed Clinical Social Worker | Admitting: Licensed Clinical Social Worker

## 2022-09-28 DIAGNOSIS — F419 Anxiety disorder, unspecified: Secondary | ICD-10-CM | POA: Diagnosis not present

## 2022-09-28 DIAGNOSIS — F332 Major depressive disorder, recurrent severe without psychotic features: Secondary | ICD-10-CM | POA: Diagnosis present

## 2022-09-28 NOTE — Progress Notes (Signed)
Virtual Visit via Video Note   I connected with Patty Castillo on 09/28/22 at  9:00 AM EDT by a video enabled telemedicine application and verified that I am speaking with the correct person using two identifiers.   At orientation to the IOP program, Case Manager discussed the limitations of evaluation and management by telemedicine and the availability of in person appointments. The patient expressed understanding and agreed to proceed with virtual visits throughout the duration of the program.   Location:  Patient: Patient Home Provider: [facility]   History of Present Illness: MDD with anxious distress  Observations/Objective: Check In: Case Manager checked in with all participants to review discharge dates, insurance authorizations, work-related documents and needs from the treatment team regarding medications. Patty Castillo stated needs and engaged in discussion.    Initial Therapeutic Activity: Counselor facilitated a check-in with Patty Castillo to assess for safety, sobriety and medication compliance.  Counselor also inquired about Patty Castillo's current emotional ratings, as well as any significant changes in thoughts, feelings or behavior since previous check in.  Patty Castillo presented for session on time and was alert, oriented x5, with no evidence or self-report of active SI/HI or A/V H.  Patty Castillo reported compliance with medication and denied use of alcohol or illicit substances.  Patty Castillo reported scores of 0/10 for depression, 2/10 for anxiety, and 1/10 for anger/irritability.  Patty Castillo denied any recent outbursts or panic attacks.  Patty Castillo reported that a recent success was bringing ice cream to her son's class yesterday, in addition to working on her quilt.  Patty Castillo reported that a recent struggle was waking up in the middle of the night due to her son not feeling well, stating "It came on suddenly".  Patty Castillo reported that her goal today is to take care of her son, but also take a walk around the neighborhood for  exercise.       Second Therapeutic Activity: Counselor provided psychoeducation on subject of boundaries with group members today using a virtual handout.  This handout defined boundaries as the limits and rules that we set for ourselves within relationships, and featured a breakdown of the 3 common categories of boundaries (i.e. porous, rigid, and healthy), along with typical traits specific to each one for easy identification.  It was noted that most people have a mixture of different boundary types depending on setting, person, and culture.  Additional information was provided on the types of boundaries (i.e. physical, intellectual, emotional, sexual, material, and time) within relationships, and what could be considered healthy versus unhealthy. Counselor tasked members with identifying what types of boundaries they presently hold within her own support systems, the collective impact these boundaries have upon their mental health, and changes that could be made in order to more effectively communicate individual mental health needs.  Intervention was effective, as evidenced by Patty Castillo actively engaging in discussion on topic, reporting that she has porous boundaries due to traits such as having a hard time saying "No" to the requests of others, and fearing rejection if she doesn't comply with others' demands.  Patty Castillo reported that discussing communication skills in group has helped her feel more empowered to turn down these requests in the future, stating "Even though its hard to say no, I am getting better".  Patty Castillo reported that this would be especially helpful in turning down requests from friends to make quilts, as she has experienced 'burnout' at times and worries about no longer experiencing joy from this hobby if a boundary isn't set soon.  Patty Castillo stated "I have to learn to put my foot down".       Assessment and Plan: Counselor recommends that Aleksia remain in IOP treatment to better manage mental  health symptoms, ensure stability and pursue completion of treatment plan goals. Counselor recommends adherence to crisis/safety plan, taking medications as prescribed, and following up with medical professionals if any issues arise.    Follow Up Instructions: Counselor will send Webex link for session tomorrow.  Patty Castillo was advised to call back or seek an in-person evaluation if the symptoms worsen or if the condition fails to improve as anticipated.   Collaboration of Care:   Medication Management AEB Dr. Eliseo Gum or Hillery Jacks, NP                                          Case Manager AEB Jeri Modena, CNA   Patient/Guardian was advised Release of Information must be obtained prior to any record release in order to collaborate their care with an outside provider. Patient/Guardian was advised if they have not already done so to contact the registration department to sign all necessary forms in order for [place] to release information regarding their care.   Consent: Patient/Guardian gives verbal consent for treatment and assignment of benefits for services provided during this visit. Patient/Guardian expressed understanding and agreed to proceed.  I provided 180 minutes of non-face-to-face time during this encounter.   Noralee Stain, LCSW, LCAS 09/28/22

## 2022-09-29 ENCOUNTER — Other Ambulatory Visit (HOSPITAL_COMMUNITY): Payer: Commercial Managed Care - PPO | Admitting: Licensed Clinical Social Worker

## 2022-09-29 DIAGNOSIS — F332 Major depressive disorder, recurrent severe without psychotic features: Secondary | ICD-10-CM

## 2022-09-29 NOTE — Progress Notes (Signed)
Virtual Visit via Video Note   I connected with Becky Sax on 09/29/22 at  9:00 AM EDT by a video enabled telemedicine application and verified that I am speaking with the correct person using two identifiers.   At orientation to the IOP program, Case Manager discussed the limitations of evaluation and management by telemedicine and the availability of in person appointments. The patient expressed understanding and agreed to proceed with virtual visits throughout the duration of the program.   Location:  Patient: Patient Home Provider: [facility]   History of Present Illness: MDD with anxious distress  Observations/Objective: Check In: Case Manager checked in with all participants to review discharge dates, insurance authorizations, work-related documents and needs from the treatment team regarding medications. Francia stated needs and engaged in discussion.    Initial Therapeutic Activity: Counselor facilitated a check-in with Rosine to assess for safety, sobriety and medication compliance.  Counselor also inquired about [place]'s current emotional ratings, as well as any significant changes in thoughts, feelings or behavior since previous check in.  Rosalynd presented for session on time and was alert, oriented x5, with no evidence or self-report of active SI/HI or A/V H.  Tonianne reported compliance with medication and denied use of alcohol or illicit substances.  Yovana reported scores of 0/10 for depression, 3/10 for anxiety, and 0/10 for anger/irritability.  Marelyn denied any recent outbursts or panic attacks.  Vaani reported that a recent success was taking some time yesterday to work on a pinata for her son's birthday, as well as a Garment/textile technologist.  Jaslynn reported that an ongoing struggle is trying to take care of her son, who is still sick.  Atha reported that her goal today is to continue monitoring her son's condition and reach out to the family doctor if condition doesn't improve.        Second Therapeutic Activity: Counselor introduced topic of grounding skills today.  Counselor defined these as simple strategies one can use to help detach from difficult thoughts or feelings temporarily by focusing on something else.  Counselor noted that grounding will not solve the problem at hand, but can provide the practitioner with time to regain control over their thoughts and/or feelings and prevent the situation from getting worse (i.e. interrupting a panic attack).  Counselor divided these into three categories (mental, physical, and soothing) and then provided examples of each which group members could practice during session.  Some of these included describing one's environment in detail or playing a categories game with oneself for mental category, taking a hot bath/shower, stretching, or carrying a grounding object for physical category, and saying kind statements, or visualizing people one cares about for soothing category.  Counselor inquired about which techniques members have used with success in the past, or will commit to learning, practicing, and applying now to improve coping abilities.  Intervention was effective, as evidenced by [name] participating in discussion on the subject, trying out several of the techniques during session, and expressing interest in adding several to her available coping skills, such as describing her environment in great detail, plying a categories game involving listing characters or episodes of 'Friends', describing an activity in great detail, using her imagination to picture herself as a carefree butterfly, looking up funny jokes that can make her laugh, going out into nature and touching natural objects around her, performing a body scan exercise, reciting kind statements to herself, and looking at photos of her children that make her smile. Kingston stated "I  feel like it works really well".  Assessment and Plan: Counselor recommends that Archita remain in  IOP treatment to better manage mental health symptoms, ensure stability and pursue completion of treatment plan goals. Counselor recommends adherence to crisis/safety plan, taking medications as prescribed, and following up with medical professionals if any issues arise.    Follow Up Instructions: Counselor will send Webex link for session tomorrow.  Kristia was advised to call back or seek an in-person evaluation if the symptoms worsen or if the condition fails to improve as anticipated.   Collaboration of Care:   Medication Management AEB Dr. Eliseo Gum or Hillery Jacks, NP                                          Case Manager AEB Jeri Modena, CNA   Patient/Guardian was advised Release of Information must be obtained prior to any record release in order to collaborate their care with an outside provider. Patient/Guardian was advised if they have not already done so to contact the registration department to sign all necessary forms in order for [place] to release information regarding their care.   Consent: Patient/Guardian gives verbal consent for treatment and assignment of benefits for services provided during this visit. Patient/Guardian expressed understanding and agreed to proceed.  I provided 180 minutes of non-face-to-face time during this encounter.   Noralee Stain, LCSW, LCAS 09/29/22

## 2022-09-30 ENCOUNTER — Other Ambulatory Visit (HOSPITAL_COMMUNITY): Payer: Commercial Managed Care - PPO | Admitting: Licensed Clinical Social Worker

## 2022-09-30 DIAGNOSIS — F332 Major depressive disorder, recurrent severe without psychotic features: Secondary | ICD-10-CM | POA: Diagnosis not present

## 2022-09-30 NOTE — Progress Notes (Signed)
Virtual Visit via Video Note   I connected with Patty Castillo on 09/30/22 at  9:00 AM EDT by a video enabled telemedicine application and verified that I am speaking with the correct person using two identifiers.   At orientation to the IOP program, Case Manager discussed the limitations of evaluation and management by telemedicine and the availability of in person appointments. The patient expressed understanding and agreed to proceed with virtual visits throughout the duration of the program.   Location:  Patient: Patient Home Provider: [facility]   History of Present Illness: MDD with anxious distress  Observations/Objective: Check In: Case Manager checked in with all participants to review discharge dates, insurance authorizations, work-related documents and needs from the treatment team regarding medications. Patty Castillo stated needs and engaged in discussion.    Initial Therapeutic Activity: Counselor facilitated a check-in with Patty Castillo to assess for safety, sobriety and medication compliance.  Counselor also inquired about Patty Castillo's current emotional ratings, as well as any significant changes in thoughts, feelings or behavior since previous check in.  Patty Castillo presented for session on time and was alert, oriented x5, with no evidence or self-report of active SI/HI or A/V H.  Patty Castillo reported compliance with medication and denied use of alcohol or illicit substances.  Patty Castillo reported scores of 0/10 for depression, 4/10 for anxiety, and 0/10 for anger/irritability.  Patty Castillo denied any recent outbursts or panic attacks.  Patty Castillo reported that a recent success has been noticing an improvement in her son's health since he fell sick, stating "His fever broke.  I'm just loading him up with vitamins and chicken noodle soup".  Patty Castillo denied any new struggles at this time.  Patty Castillo reported that her goal today is to work on a pinata with her children, but take time to relax and practice grounding since her  anxiety is present.       Second Therapeutic Activity: Counselor discussed topic of distress tolerance today with group.  Counselor shared virtual handout with members that offered a DBT approach represented by the acronym ACCEPTS, and outlined strategies for distracting oneself from distressing emotions, allowing appropriate time for these emotions to lesson in intensity and eventually fade away.  Strategies offered included engaging in positive activities, contributing to the wellbeing of others, comparing one's present situation to a previously difficult one to highlight resilience, using mental imagery, and physical grounding.  Counselor assisted members in creating their own realistic ACCEPTS plan for tackling a distressing emotion of their choice.  Intervention was effective, as evidenced by Patty Castillo participating in creation of the plan, choosing anxious as her emotion of focus, and identifying several helpful approaches for distraction, such as organizing her home, making a quilt for a friend that is also pregnant, visualizing a better outcome for the difficult situation, or using essential oils with calming scents.    Third Therapeutic Activity: Psycho-educational portion of group was provided by Virgina Evener, Interior and spatial designer of community education with [employer].  Patty Castillo provided information on history of her local agency, mission statement, and the variety of unique services offered which group members might find beneficial to engage in, including both virtual and in-person support groups, as well as peer support program for mentoring.  Patty Castillo offered time to answer member's questions regarding services and encouraged them to consider utilizing these services to assist in working towards their individual wellness goals.  Intervention was effective, as evidenced by Patty Castillo participating in discussion with speaker on the subject, reporting that the 'Building Self-Esteem' workshop was appealing  to her, so she would consider outreaching Kellin about admission before discharge from [facility].     Assessment and Plan: Counselor recommends that Patty Castillo remain in IOP treatment to better manage mental health symptoms, ensure stability and pursue completion of treatment plan goals. Counselor recommends adherence to crisis/safety plan, taking medications as prescribed, and following up with medical professionals if any issues arise.    Follow Up Instructions: Counselor will send Webex link for session tomorrow.  Patty Castillo was advised to call back or seek an in-person evaluation if the symptoms worsen or if the condition fails to improve as anticipated.   Collaboration of Care:   Medication Management AEB Dr. Eliseo Gum or Hillery Jacks, NP                                          Case Manager AEB Jeri Modena, CNA   Patient/Guardian was advised Release of Information must be obtained prior to any record release in order to collaborate their care with an outside provider. Patient/Guardian was advised if they have not already done so to contact the registration department to sign all necessary forms in order for [place] to release information regarding their care.   Consent: Patient/Guardian gives verbal consent for treatment and assignment of benefits for services provided during this visit. Patient/Guardian expressed understanding and agreed to proceed.  I provided 180 minutes of non-face-to-face time during this encounter.   Noralee Stain, LCSW, LCAS 09/30/22

## 2022-10-03 ENCOUNTER — Other Ambulatory Visit (HOSPITAL_COMMUNITY): Payer: Commercial Managed Care - PPO | Admitting: Licensed Clinical Social Worker

## 2022-10-03 DIAGNOSIS — F332 Major depressive disorder, recurrent severe without psychotic features: Secondary | ICD-10-CM | POA: Diagnosis not present

## 2022-10-03 NOTE — Progress Notes (Signed)
Virtual Visit via Video Note   I connected with Patty Castillo on 10/03/22 at  9:00 AM EDT by a video enabled telemedicine application and verified that I am speaking with the correct person using two identifiers.   At orientation to the IOP program, Case Manager discussed the limitations of evaluation and management by telemedicine and the availability of in person appointments. The patient expressed understanding and agreed to proceed with virtual visits throughout the duration of the program.   Location:  Patient: Patient Home [facility]: [facility]   History of Present Illness: MDD with anxious distress  Observations/Objective: Check In: Case Manager checked in with all participants to review discharge dates, insurance authorizations, work-related documents and needs from the treatment team regarding medications. Patty Castillo stated needs and engaged in discussion.    Initial Therapeutic Activity: Counselor facilitated a check-in with Patty Castillo to assess for safety, sobriety and medication compliance.  Counselor also inquired about Patty Castillo's current emotional ratings, as well as any significant changes in thoughts, feelings or behavior since previous check in.  Patty Castillo presented for session on time and was alert, oriented x5, with no evidence or self-report of active SI/HI or A/V H.  Patty Castillo reported compliance with medication and denied use of alcohol or illicit substances.  Patty Castillo reported scores of 0/10 for depression, 4/10 for anxiety, and 0/10 for anger/irritability.  Patty Castillo denied any recent outbursts or panic attacks.  Patty Castillo reported that a recent success was ordering a new microwave over the weekend, which she has been needing for some time now.  Patty Castillo reported that a recent struggle has been feeling 'moody', which she believes might be hormonal and related to the pregnancy.  Patty Castillo reported that her goal today is to work on her quilt and do some cleaning.         Second Therapeutic Activity:  Counselor introduced topic of mental illness stigma today.  Counselor explained how stigma is defined as someone viewing another person in a negative way due to distinguishing characteristics or traits which are thought to be a disadvantage, including negative beliefs or attitudes associated with people who have a mental health condition such as major depression and/or generalized anxiety.  Counselor explained that stigma can lead to discrimination as well, and the harmful effects of pervasive stigma include reluctance to seek help or treatment, lack of understanding by family, friends, co-workers or peers, bullying/harassment, or negative impact on one's perceived ability to overcome challenges or succeed in life.  Counselor offered several suggestions for coping with stigma regarding mental illness, including staying engaged in treatment through linkage with a therapist, psychiatrist, and/or support group, avoiding isolation, and speaking out against stigma when encountered to reverse societal impact and increase acceptance and understanding.  Counselor inquired about members' experiences with this issue, as well as what they have done to minimize the impact it can have on their lives.  Intervention was effective, as evidenced by Patty Castillo actively engaging in discussion on the subject, and completing an initial screening, which indicated a moderate amount of stigma on her mental illness. Patty Castillo reported that it is personally frustrating having a diagnosis, and she has felt resentful at times for others not having to experience the same struggles, stating "Why does everybody have a perfect life?  Why can't I get out of this?"  Patty Castillo reported that at times, this stigma has led to her isolating from supports, or withholding details about how she feels for fear of discrimination.  Patty Castillo reported that she is motivated  to be more kind and compassionate towards herself moving forward with treatment, stating "I need to  stop putting myself down so much, because it just makes it worse".  Patty Castillo reported that it was a relief to learn how prevalent mental illness is in society, since she feels less alone now.     Assessment and Plan: Counselor recommends that Patty Castillo remain in IOP treatment to better manage mental health symptoms, ensure stability and pursue completion of treatment plan goals. Counselor recommends adherence to crisis/safety plan, taking medications as prescribed, and following up with medical professionals if any issues arise.    Follow Up Instructions: Counselor will send Webex link for session tomorrow.  Patty Castillo was advised to call back or seek an in-person evaluation if the symptoms worsen or if the condition fails to improve as anticipated.   Collaboration of Care:   Medication Management AEB Dr. Eliseo Gum or Hillery Jacks, NP                                          Case Manager AEB Jeri Modena, CNA   Patient/Guardian was advised Release of Information must be obtained prior to any record release in order to collaborate their care with an outside provider. Patient/Guardian was advised if they have not already done so to contact the registration department to sign all necessary forms in order for [place] to release information regarding their care.   Consent: Patient/Guardian gives verbal consent for treatment and assignment of benefits for services provided during this visit. Patient/Guardian expressed understanding and agreed to proceed.  I provided 180 minutes of non-face-to-face time during this encounter.   Noralee Stain, LCSW, LCAS 10/03/22

## 2022-10-04 ENCOUNTER — Other Ambulatory Visit (HOSPITAL_COMMUNITY): Payer: Commercial Managed Care - PPO | Admitting: Psychiatry

## 2022-10-04 DIAGNOSIS — F332 Major depressive disorder, recurrent severe without psychotic features: Secondary | ICD-10-CM | POA: Diagnosis not present

## 2022-10-04 NOTE — Progress Notes (Signed)
Patient ID: Patty Castillo, female   DOB: 08-08-1985, 37 y.o.   MRN: [id number] Virtual Visit via Video Note  I connected with Patty Castillo on @TODAY @ at  9:00 AM EDT by a video enabled telemedicine application and verified that I am speaking with the correct person using two identifiers.  Location: Patient: at home Provider: at office   I discussed the limitations of evaluation and management by telemedicine and the availability of in person appointments. The patient expressed understanding and agreed to proceed.  I discussed the assessment and treatment plan with the patient. The patient was provided an opportunity to ask questions and all were answered. The patient agreed with the plan and demonstrated an understanding of the instructions.   The patient was advised to call back or seek an in-person evaluation if the symptoms worsen or if the condition fails to improve as anticipated.  I provided 30 minutes of non-face-to-face time during this encounter.   Eugena Rhue, RITA, M.Ed,CNA  D:  As per previous CCA states:  "This is a 37 yr old, married, unemployed, Caucasian female, who was referred per Eligha Bridegroom, NP [facility]); due to worsening anxiety/depressive sx's with passive SI.  Pt denies a plan or intent.  No prior hx of suicide attempts/gestures.  Denies A/V hallucinations.  Pt is currently [redacted] weeks pregnant.  Scored 27 on PHQ-9.  Reports no apparent stressors.  Husband of 8.5 yrs is very supportive.  Pt states since pregnancy she has been feeling worst.  "I feel like my meds aren't working, in which they used to.  I am hyper-focused/fixated on my body.  I have irrational thoughts that if I do----I won't be able to eat and if I do ----I won't be able to sleep.  I just think all the time and worry all the time."  Pt has been seeing her therapist  Idamae Schuller) at [employer] once a week virtually and has a new pt appt with a female psychiatrist on 09-26-22 in  [place], [place].  Pt denies any prior psychiatric hospitalizations.  Is currently on 40 mg Celexa daily.  Family hx:  Mom (had undiagnosed mental health issues); Paternal Uncle:  Bipolar D/O; Paternal GM:  depression/anxiety.   Current Symptoms/Problems: decreased energy, sadness, anxious, restless, decreased sleep d/t awakenings, poor appetite, decreased self-esteem, poor concentration, passive SI, tearfulness, irritable, isolated"  Pt attended all 14 virtual MH-IOP days.  Reports doing "Ok" overall.  "I am currently working on regulating my medications with my new psychiatrist."  Pt c/o feeling very tense and shaky.  On a scale of 1-10 (10 being the worst); pt rates her depression at a 0 an anxiety at a 4.  Denies SI/HI or A/V hallucinations.  Reports the groups were wonderful.  "It was great having the support of others and getting a refresher on skills." A:  D/C today.  F/U with Bernerd Pho, CNM on 10-07-22 @ 11:30 a.m and Idamae Schuller (therapist) on 10-05-22 @ 10 a.m.  Encouraged support groups through [facility].   Pt was advised of ROI must be obtained prior to any records release in order to collaborate her care with an outside provider.  Pt was advised if she has not already done so to contact the front desk to sign all necessary forms in order for MH-IOP to release info re: her care.  Consent:  Pt gives verbal consent for tx and assignment of benefits for services provided during this telehealth group process.  Pt expressed understanding  and agreed to proceed. Collaboration of care:  Collaborate with Dr. Eliseo Gum AEB Bernerd Pho, CNM AEB and  Idamae Schuller, LCSW AEB and [facility], LCSW AEB.   R:  Pt receptive.   Jeri Modena, M.Ed,CNA

## 2022-10-04 NOTE — Patient Instructions (Addendum)
D:  Patient completed virtual MH-IOP today.  A:  Discharge patient.  Follow up with Bernerd Pho, CNM on 10-07-22 @ 11:30 a.m; and Idamae Schuller (therapist) on 10-05-22 @ 10 a.m..  Recommend support groups through [facility] [phone].  R:  Patient receptive.

## 2022-10-04 NOTE — Progress Notes (Unsigned)
Virtual Visit via Video Note  I connected with [name] on 10/04/22 at  9:00 AM EDT by a video enabled telemedicine application and verified that I am speaking with the correct person using two identifiers.  Location: Patient: Home Provider: Office   I discussed the limitations of evaluation and management by telemedicine and the availability of in person appointments. The patient expressed understanding and agreed to proceed.      I discussed the assessment and treatment plan with the patient. The patient was provided an opportunity to ask questions and all were answered. The patient agreed with the plan and demonstrated an understanding of the instructions.   The patient was advised to call back or seek an in-person evaluation if the symptoms worsen or if the condition fails to improve as anticipated.    Bobbye Morton, MD  [facility] Intensive Outpatient Program Discharge Summary  Latriece Wehrs [id number]  Admission date: 09/15/2022 Discharge date: 10/04/2022  Reason for admission: Anxiety  Chemical Use History: none  Family of Origin Issues: P Uncle: Bipolar P GMA: Anxiety and depression Mom would just jump out of the car (when her dad was driving) down the freeway, when upset. Mom would hit herself in front of them. Mom would go up on the roof and crawl through the window when patient was upset with her.   Progress in Program Toward Treatment Goals: Progressing  Progress (rationale):   Patient reports that she is doing ok. Patient reports that her OB has rx lamictal, but patient has not taken it. Patient reports that she is still taking Celexa 40mg . She wants to try to just use coping skills and Celexa for now. Patient reports that now she has good days and bad days. She reports that she is considering Lamictal. Patient reports that she sleeps with Trazodone PRN, but feels like she does not need it as much. She reports a good  appetite.   Patient reports that she has really been able to identify her triggers and reframe her thought process and this has helped her feel more in control of her anxiety.  She thinks that the combo of meds and therapy has been helpful. She reports that she is still practicing the skills, but may start the lamictal for continued anxiety. She denies SI, HI, and AVH. Objectively, Patient does appear more relaxed than on admission.   Psychiatric Specialty Exam:   Review of Systems  Psychiatric/Behavioral:  Negative for hallucinations, sleep disturbance and suicidal ideas. The patient is nervous/anxious.     currently breastfeeding.There is no height or weight on file to calculate BMI.  General Appearance: Casual  Eye Contact:  Good  Speech:  Clear and Coherent  Volume:  Normal  Mood:  Euthymic  Affect:  Appropriate  Thought Process:  Coherent  Orientation:  Full (Time, Place, and Person)  Thought Content:  Logical  Suicidal Thoughts:  No  Homicidal Thoughts:  No  Memory:  Immediate;   Good Recent;   Good  Judgement:  Fair  Insight:  Fair  Psychomotor Activity:  Normal  Concentration:  Concentration: Fair  Recall:  NA  Fund of Knowledge:  Good  Language:  Good  Akathisia:  No  Handed:    AIMS (if indicated):     Assets:  Communication Skills Desire for Improvement Financial Resources/Insurance Housing Leisure Time Resilience Social Support Transportation Vocational/Educational  ADL's:  Intact  Cognition:  WNL  Sleep:   Fair    GAD -Continue Celexa  40mg  daily, no refill needed - Will  f/u with Perinatal psychiatsit and therapist this week  Collaboration of Care:   Patient/Guardian was advised Release of Information must be obtained prior to any record release in order to collaborate their care with an outside provider. Patient/Guardian was advised if they have not already done so to contact the registration department to sign all necessary forms in order for [place] to  release information regarding their care.   Consent: Patient/Guardian gives verbal consent for treatment and assignment of benefits for services provided during this visit. Patient/Guardian expressed understanding and agreed to proceed.    PGY-3 Eliseo Gum, MD [place], RITA 10/04/2022

## 2022-10-04 NOTE — Progress Notes (Signed)
Virtual Visit via Video Note   I connected with Patty Castillo on 10/04/22 at  9:00 AM EDT by a video enabled telemedicine application and verified that I am speaking with the correct person using two identifiers.   At orientation to the IOP program, Case Manager discussed the limitations of evaluation and management by telemedicine and the availability of in person appointments. The patient expressed understanding and agreed to proceed with virtual visits throughout the duration of the program.   Location:  Patient: Patient Home Provider: OPT BH Office   History of Present Illness: MDD with anxious distress  Observations/Objective: Check In: Case Manager checked in with all participants to review discharge dates, insurance authorizations, work-related documents and needs from the treatment team regarding medications. Patty Castillo stated needs and engaged in discussion.    Initial Therapeutic Activity: Counselor facilitated a check-in with Patty Castillo to assess for safety, sobriety and medication compliance.  Counselor also inquired about Patty Castillo's current emotional ratings, as well as any significant changes in thoughts, feelings or behavior since previous check in.  Patty Castillo presented for session on time and was alert, oriented x5, with no evidence or self-report of active SI/HI or A/V H.  Patty Castillo reported compliance with medication and denied use of alcohol or illicit substances.  Patty Castillo reported scores of 0/10 for depression, 4/10 for anxiety, and 0/10 for anger/irritability.  Patty Castillo denied any recent outbursts or panic attacks.  Patty Castillo reported that a recent success was celebrating her father's birthday from a distance, who is currently staying in [place].  She reported that she was also able to straighten up around the home.  Patty Castillo reported that one struggle from the previous day was noticing her 'OCD acting up', which caused her to hyperventilate at one point.  Patty Castillo reported that her goal today is to  straighten up a closet after she takes her son to gymnastics class.         Second Therapeutic Activity: Counselor introduced topic of assertive communication today.  Counselor shared various handouts with members virtually in group to read along with on the subject.  These handouts defined assertive communication as a communication style in which a person stands up for their own needs and wants, while also taking into consideration the needs and wants of others, without behaving in a passive or aggressive way.  Traits of assertive communicators were highlighted such as using appropriate speaking volume, maintaining eye contact, using confident language, and avoiding interruption.  Members were also provided with tips on how to improve communication, including respecting oneself, expressing thoughts and feelings calmly, and saying "No" when necessary.  Members were given a variety of scenarios where they could practice using these tips to respond in an assertive manner.  Intervention was effective, as evidenced by Patty Castillo participating in discussion on topic, reporting that she has a passive communication style due to traits such as prioritizing the needs of others over her own, not expressing her own needs, and lacking confidence.  Patty Castillo reported that this has led to issues such as regret and frustration when she has given other people more priority in the relationship, stating "Passive is me by definition, I do feel like I've gotten better at some things, like being more vocal about my needs.  Its just with people I'm comfortable with though, the ones I can trust. I'm still a 'yes' person with some of my friends".  Patty Castillo reported that she is motivated to set healthier boundaries with her support network to prioritize her needs,  and showed more effective use of assertive communication skills through engagement in roleplay activities today.    Assessment and Plan: Patty Castillo has completed MHIOP and will be  discharged.  Counselor recommends adherence to crisis/safety plan, taking medications as prescribed, and following up with medical professionals if any issues arise.    Follow Up Instructions: Patty Castillo was advised to call back or seek an in-person evaluation if the symptoms worsen or if the condition fails to improve as anticipated.   Collaboration of Care:   Medication Management AEB Dr. Eliseo Gum or Hillery Jacks, NP                                          Case Manager AEB Jeri Modena, CNA   Patient/Guardian was advised Release of Information must be obtained prior to any record release in order to collaborate their care with an outside provider. Patient/Guardian was advised if they have not already done so to contact the registration department to sign all necessary forms in order for [place] to release information regarding their care.   Consent: Patient/Guardian gives verbal consent for treatment and assignment of benefits for services provided during this visit. Patient/Guardian expressed understanding and agreed to proceed.  I provided 180 minutes of non-face-to-face time during this encounter.   Noralee Stain, LCSW, LCAS 10/04/22

## 2022-10-05 ENCOUNTER — Other Ambulatory Visit (HOSPITAL_COMMUNITY): Payer: Commercial Managed Care - PPO

## 2022-11-29 ENCOUNTER — Other Ambulatory Visit: Payer: Self-pay

## 2022-11-29 ENCOUNTER — Encounter (HOSPITAL_COMMUNITY): Payer: Self-pay | Admitting: Obstetrics & Gynecology

## 2022-11-29 ENCOUNTER — Observation Stay (HOSPITAL_COMMUNITY)
Admission: AD | Admit: 2022-11-29 | Discharge: 2022-12-02 | Disposition: A | Discharge status: Home / Self Care | Payer: Commercial Managed Care - PPO | Attending: Obstetrics and Gynecology | Admitting: Obstetrics and Gynecology

## 2022-11-29 DIAGNOSIS — Z3A35 35 weeks gestation of pregnancy: Secondary | ICD-10-CM | POA: Diagnosis not present

## 2022-11-29 DIAGNOSIS — Z3A24 24 weeks gestation of pregnancy: Secondary | ICD-10-CM | POA: Diagnosis not present

## 2022-11-29 DIAGNOSIS — O4702 False labor before 37 completed weeks of gestation, second trimester: Secondary | ICD-10-CM | POA: Diagnosis present

## 2022-11-29 DIAGNOSIS — O4703 False labor before 37 completed weeks of gestation, third trimester: Principal | ICD-10-CM | POA: Insufficient documentation

## 2022-11-29 DIAGNOSIS — O479 False labor, unspecified: Principal | ICD-10-CM

## 2022-11-29 LAB — FETAL FIBRONECTIN: Fetal Fibronectin: POSITIVE — AB

## 2022-11-29 LAB — WET PREP, GENITAL
Clue Cells Wet Prep HPF POC: NONE SEEN
Sperm: NONE SEEN
Trich, Wet Prep: NONE SEEN
WBC, Wet Prep HPF POC: >=10 — AB (ref <10)
Yeast Wet Prep HPF POC: NONE SEEN

## 2022-11-29 LAB — URINALYSIS, ROUTINE W REFLEX MICROSCOPIC
Bilirubin Urine: NEGATIVE
Glucose, UA: NEGATIVE mg/dL
Hgb urine dipstick: NEGATIVE
Ketones, ur: 5 mg/dL — AB
Leukocytes,Ua: NEGATIVE
Nitrite: NEGATIVE
Protein, ur: NEGATIVE mg/dL
Specific Gravity, Urine: 1.004 — ABNORMAL LOW (ref 1.005–1.030)
pH: 7 (ref 5.0–8.0)

## 2022-11-29 LAB — CBC
HCT: 33.2 % — ABNORMAL LOW (ref 36.0–46.0)
Hemoglobin: 10.9 g/dL — ABNORMAL LOW (ref 12.0–15.0)
MCH: 32.2 pg (ref 26.0–34.0)
MCHC: 32.8 g/dL (ref 30.0–36.0)
MCV: 97.9 fL (ref 80.0–100.0)
Platelets: 194 10*3/uL (ref 150–400)
RBC: 3.39 MIL/uL — ABNORMAL LOW (ref 3.87–5.11)
RDW: 12.9 % (ref 11.5–15.5)
WBC: 9.1 10*3/uL (ref 4.0–10.5)
nRBC: 0 % (ref 0.0–0.2)

## 2022-11-29 LAB — TYPE AND SCREEN
ABO/RH(D): O POS
Antibody Screen: NEGATIVE

## 2022-11-29 MED ORDER — BETAMETHASONE SOD PHOS & ACET 6 (3-3) MG/ML IJ SUSP
12.0000 mg | Freq: Once | INTRAMUSCULAR | Status: AC
  Administered 2022-11-29: 12 mg via INTRAMUSCULAR
  Filled 2022-11-29: qty 5

## 2022-11-29 MED ORDER — TERBUTALINE SULFATE 1 MG/ML IJ SOLN
0.2500 mg | Freq: Once | INTRAMUSCULAR | Status: AC
  Administered 2022-11-29: 0.25 mg via SUBCUTANEOUS

## 2022-11-29 MED ORDER — CITALOPRAM HYDROBROMIDE 40 MG PO TABS
40.0000 mg | ORAL_TABLET | Freq: Every day | ORAL | Status: DC
  Administered 2022-11-29 – 2022-12-01 (×3): 40 mg via ORAL
  Filled 2022-11-29 (×4): qty 1

## 2022-11-29 MED ORDER — LACTATED RINGERS IV SOLN: INTRAVENOUS | Status: DC

## 2022-11-29 MED ORDER — MAGNESIUM SULFATE 40 GM/1000ML IV SOLN
INTRAVENOUS | Status: AC
  Filled 2022-11-29: qty 1000

## 2022-11-29 MED ORDER — LACTATED RINGERS IV BOLUS
500.0000 mL | Freq: Once | INTRAVENOUS | Status: AC
  Administered 2022-11-29: 500 mL via INTRAVENOUS

## 2022-11-29 MED ORDER — PRENATAL MULTIVITAMIN CH
1.0000 | ORAL_TABLET | Freq: Every day | ORAL | Status: DC
  Administered 2022-11-30 – 2022-12-01 (×2): 1 via ORAL
  Filled 2022-11-29 (×2): qty 1

## 2022-11-29 MED ORDER — LEVOTHYROXINE SODIUM 50 MCG PO TABS: 50.0000 ug | ORAL_TABLET | Freq: Every day | ORAL | Status: DC

## 2022-11-29 MED ORDER — LACTATED RINGERS IV BOLUS
1000.0000 mL | Freq: Once | INTRAVENOUS | Status: AC
  Administered 2022-11-29: 1000 mL via INTRAVENOUS

## 2022-11-29 MED ORDER — CALCIUM CARBONATE ANTACID 500 MG PO CHEW: 2.0000 | CHEWABLE_TABLET | ORAL | Status: DC | PRN

## 2022-11-29 MED ORDER — MAGNESIUM SULFATE 40 GM/1000ML IV SOLN
2.0000 g/h | INTRAVENOUS | Status: DC
  Administered 2022-11-29 – 2022-11-30 (×2): 2 g/h via INTRAVENOUS
  Filled 2022-11-29: qty 1000

## 2022-11-29 MED ORDER — TERBUTALINE SULFATE 1 MG/ML IJ SOLN
0.2500 mg | Freq: Once | INTRAMUSCULAR | Status: AC
  Administered 2022-11-29: 0.25 mg via SUBCUTANEOUS
  Filled 2022-11-29: qty 1

## 2022-11-29 MED ORDER — ZOLPIDEM TARTRATE 5 MG PO TABS
5.0000 mg | ORAL_TABLET | Freq: Every evening | ORAL | Status: DC | PRN
  Administered 2022-11-30 (×2): 5 mg via ORAL
  Filled 2022-11-29 (×2): qty 1

## 2022-11-29 MED ORDER — LEVOTHYROXINE SODIUM 25 MCG PO TABS
25.0000 ug | ORAL_TABLET | Freq: Every day | ORAL | Status: DC
  Administered 2022-11-30 – 2022-12-02 (×3): 25 ug via ORAL
  Filled 2022-11-29 (×4): qty 1

## 2022-11-29 MED ORDER — LACTATED RINGERS IV SOLN: 125.0000 mL/h | INTRAVENOUS | Status: AC

## 2022-11-29 MED ORDER — DOCUSATE SODIUM 100 MG PO CAPS
100.0000 mg | ORAL_CAPSULE | Freq: Every day | ORAL | Status: DC
  Administered 2022-11-30 – 2022-12-01 (×2): 100 mg via ORAL
  Filled 2022-11-29 (×2): qty 1

## 2022-11-29 MED ORDER — CITALOPRAM HYDROBROMIDE 20 MG PO TABS: 20.0000 mg | ORAL_TABLET | Freq: Two times a day (BID) | ORAL | Status: DC

## 2022-11-29 MED ORDER — ACETAMINOPHEN 325 MG PO TABS: 650.0000 mg | ORAL_TABLET | ORAL | Status: DC | PRN

## 2022-11-29 MED ORDER — MAGNESIUM SULFATE BOLUS VIA INFUSION
4.0000 g | Freq: Once | INTRAVENOUS | Status: AC
  Administered 2022-11-29: 4 g via INTRAVENOUS
  Filled 2022-11-29: qty 1000

## 2022-11-29 MED ORDER — FERROUS SULFATE 325 (65 FE) MG PO TABS
325.0000 mg | ORAL_TABLET | Freq: Every day | ORAL | Status: DC
  Administered 2022-11-30 – 2022-12-02 (×3): 325 mg via ORAL
  Filled 2022-11-29 (×4): qty 1

## 2022-11-29 MED ORDER — BETAMETHASONE SOD PHOS & ACET 6 (3-3) MG/ML IJ SUSP
12.0000 mg | INTRAMUSCULAR | Status: AC
  Administered 2022-11-30: 12 mg via INTRAMUSCULAR

## 2022-11-29 NOTE — Progress Notes (Signed)
Pt informed that the ultrasound is considered a limited OB ultrasound and is not intended to be a complete ultrasound exam.  Patient also informed that the ultrasound is not being completed with the intent of assessing for fetal or placental anomalies or any pelvic abnormalities.  Explained that the purpose of today's ultrasound is to assess for  presentation.  Patient acknowledges the purpose of the exam and the limitations of the study.  [facility] Confirmed.  

## 2022-11-29 NOTE — MAU Provider Note (Signed)
History     CSN: [id number]  Arrival date and time: 11/29/22 1702   Event Date/Time   First Provider Initiated Contact with Patient 11/29/22 1738      Chief Complaint  Patient presents with   Contractions   [name] is a 37 y.o. year old G15P2002 female at [redacted]w[redacted]d weeks gestation who presents to MAU reporting contractions since this  morning while she was walking around the [employer] today. She called her OB office and was advised to drink water and rest. The contractions worsened and became every 5 minutes while at home. She denies any VB or LOF. She reports good (+) FM. She receives [facility]; next appt is 12/23/2022. Her spouse is present and contributing to the history taking.    OB History     Gravida  3   Para  2   Term  2   Preterm      AB      Living  2      SAB      IAB      Ectopic      Multiple  0   Live Births  2           Past Medical History:  Diagnosis Date   Anxiety    Depression    Hx of varicella     Past Surgical History:  Procedure Laterality Date   NO PAST SURGERIES      Family History  Problem Relation Age of Onset   Cancer Mother        breast   Hypothyroidism Mother    Breast cancer Mother    Hypertension Father    Depression Sister    Hypothyroidism Maternal Aunt    Bipolar disorder Paternal Uncle    Diabetes Maternal Grandfather    Alzheimer's disease Maternal Grandmother    Cancer Paternal Grandfather    Anxiety disorder Paternal Grandmother    Mental illness Paternal Grandmother    Depression Paternal Grandmother    Cancer Paternal Grandmother        breast   Breast cancer Paternal Grandmother     Social History   Tobacco Use   Smoking status: Never   Smokeless tobacco: Never  Vaping Use   Vaping Use: Never used  Substance Use Topics   Alcohol use: No   Drug use: No    Allergies:  Allergies  Allergen Reactions   Doxycycline Nausea And Vomiting    Latex Hives and Rash    Medications Prior to Admission  Medication Sig Dispense Refill Last Dose   citalopram (CELEXA) 40 MG tablet Take 1 tablet (40 mg total) by mouth every evening. 30 tablet 0 11/28/2022   levothyroxine (SYNTHROID) 25 MCG tablet Take 25 mcg by mouth daily before breakfast.   11/29/2022   prenatal vitamin w/FE, FA (PRENATAL 1 + 1) 27-1 MG TABS tablet Take 1 tablet by mouth at bedtime.    11/29/2022   ALPRAZolam (XANAX) 0.25 MG tablet Take 1 tablet (0.25 mg total) by mouth as needed for anxiety. 10 tablet 0    traZODone (DESYREL) 50 MG tablet TAKE ONE TABLET BY MOUTH EVERY NIGHT AT BEDTIME AS NEEDED FOR SLEEP 30 tablet 0     Review of Systems  Constitutional: Negative.   HENT: Negative.    Eyes: Negative.   Respiratory: Negative.    Cardiovascular: Negative.   Gastrointestinal: Negative.   Endocrine: Negative.   Genitourinary:  Positive for pelvic  pain (contractions).  Musculoskeletal: Negative.   Skin: Negative.   Allergic/Immunologic: Negative.   Neurological: Negative.   Hematological: Negative.   Psychiatric/Behavioral: Negative.     Physical Exam   Blood pressure 112/64, pulse (!) 103, temperature 98.9 F (37.2 C), temperature source Oral, resp. rate 16, height 5\' 7"  (1.702 m), weight 61.3 kg, SpO2 100 %, currently breastfeeding.  Physical Exam Vitals and nursing note reviewed. Exam conducted with a chaperone present.  Constitutional:      Appearance: Normal appearance. She is normal weight.  Cardiovascular:     Rate and Rhythm: Normal rate.  Pulmonary:     Effort: Pulmonary effort is normal.  Abdominal:     Palpations: Abdomen is soft.  Genitourinary:    General: Normal vulva.     Comments: Pelvic exam: External genitalia normal, SE: vaginal walls pink and well rugated, cervix is smooth, pink, no lesions, scant amt of thick, white vaginal d/c -- fFN, WP, GC/CT done, cervix visually dilated, Uterus is non-tender, S=D, no CMT or friability, no adnexal  tenderness.  Dilation: 1 Effacement (%): Thick Exam by:: Raelyn Mora, CNM  Musculoskeletal:        General: Normal range of motion.  Skin:    General: Skin is warm and dry.  Neurological:     Mental Status: She is alert and oriented to person, place, and time.  Psychiatric:        Mood and Affect: Mood normal.        Behavior: Behavior normal.        Thought Content: Thought content normal.        Judgment: Judgment normal.    REACTIVE NST - FHR: 145 bpm / moderate variability / accels present / decels absent / TOCO: regular every 2-3 mins  MAU Course  Procedures  MDM CCUA fFN Wet Prep GC/CT -- Results pending Terbutaline 0.25 mg SQ -- stopped UC's Start IV  LR Bolus 1 liter @ 999 ml/hr x 2 CBC  T&S  Results for orders placed or performed during the hospital encounter of 11/29/22 (from the past 24 hour(s))  Urinalysis, Routine w reflex microscopic -Urine, Clean Catch     Status: Abnormal   Collection Time: 11/29/22  5:45 PM  Result Value Ref Range   Color, Urine STRAW (A) YELLOW   APPearance CLEAR CLEAR   Specific Gravity, Urine 1.004 (L) 1.005 - 1.030   pH 7.0 5.0 - 8.0   Glucose, UA NEGATIVE NEGATIVE mg/dL   Hgb urine dipstick NEGATIVE NEGATIVE   Bilirubin Urine NEGATIVE NEGATIVE   Ketones, ur 5 (A) NEGATIVE mg/dL   Protein, ur NEGATIVE NEGATIVE mg/dL   Nitrite NEGATIVE NEGATIVE   Leukocytes,Ua NEGATIVE NEGATIVE  CBC     Status: Abnormal   Collection Time: 11/29/22  5:48 PM  Result Value Ref Range   WBC 9.1 4.0 - 10.5 K/uL   RBC 3.39 (L) 3.87 - 5.11 MIL/uL   Hemoglobin 10.9 (L) 12.0 - 15.0 g/dL   HCT 16.1 (L) 09.6 - 04.5 %   MCV 97.9 80.0 - 100.0 fL   MCH 32.2 26.0 - 34.0 pg   MCHC 32.8 30.0 - 36.0 g/dL   RDW 40.9 81.1 - 91.4 %   Platelets 194 150 - 400 K/uL   nRBC 0.0 0.0 - 0.2 %  Type and screen     Status: None (Preliminary result)   Collection Time: 11/29/22  5:48 PM  Result Value Ref Range   ABO/RH(D) PENDING    Antibody Screen  PENDING     Sample Expiration      12/02/2022,2359 Performed at [facility], [address], [place], [place] [address]   Fetal fibronectin     Status: Abnormal   Collection Time: 11/29/22  5:49 PM  Result Value Ref Range   Fetal Fibronectin POSITIVE (A) NEGATIVE  Wet prep, genital     Status: Abnormal   Collection Time: 11/29/22  5:49 PM   Specimen: Vaginal  Result Value Ref Range   Yeast Wet Prep HPF POC NONE SEEN NONE SEEN   Trich, Wet Prep NONE SEEN NONE SEEN   Clue Cells Wet Prep HPF POC NONE SEEN NONE SEEN   WBC, Wet Prep HPF POC >=10 (A) <10   Sperm NONE SEEN    *Consult with Dr. Langston Masker @ [phone] - notified of patient's complaints, assessments, lab & NST results, tx plan admit to [facility] - agrees with plan - will put orders in  Assessment and Plan  Preterm Labor - Admit to OBSCU - Routine Antenatal orders - Dr. Langston Masker to enter orders - See Dr. Langston Masker' H&P documentation   Raelyn Mora, CNM 11/29/2022, 5:38 PM

## 2022-11-29 NOTE — H&P (Signed)
Patty Castillo is a 37 y.o. female G3P2002 at [redacted]w[redacted]d presenting to MAU with PTC.  Patient states she was at [employer] today with her other 2 children when CTX started.  She tried hydration and rest but CTX came more frequently and with more intensity.  No LOF or VB.  Active FM.  CVX in MAU 1/thick/posterior.  FFN returned positive.  Patient reports improvement in CTX since receiving terbutaline but they are starting to come back.  Antepartum course complicated by anxiety which is well controlled on citalopram.  She also has hypothyroidism and takes levothyroxine 25 mcg.  Patient had low lying placenta on last u/s on 5/29.    OB History     Gravida  3   Para  2   Term  2   Preterm      AB      Living  2      SAB      IAB      Ectopic      Multiple  0   Live Births  2          Past Medical History:  Diagnosis Date   Anxiety    Depression    Hx of varicella    Past Surgical History:  Procedure Laterality Date   NO PAST SURGERIES     Family History: family history includes Alzheimer's disease in her maternal grandmother; Anxiety disorder in her paternal grandmother; Bipolar disorder in her paternal uncle; Breast cancer in her mother and paternal grandmother; Cancer in her mother, paternal grandfather, and paternal grandmother; Depression in her paternal grandmother and sister; Diabetes in her maternal grandfather; Hypertension in her father; Hypothyroidism in her maternal aunt and mother; Mental illness in her paternal grandmother. Social History:  reports that she has never smoked. She has never used smokeless tobacco. She reports that she does not drink alcohol and does not use drugs.     Maternal Diabetes: No Genetic Screening: Normal Maternal Ultrasounds/Referrals: Normal Fetal Ultrasounds or other Referrals:  None Maternal Substance Abuse:  No Significant Maternal Medications:  Meds include: Syntroid Other: citalopram Significant Maternal Lab  Results:  None Number of Prenatal Visits:greater than 3 verified prenatal visits Other Comments:  None  Review of Systems Maternal Medical History:  Reason for admission: Contractions.   Contractions: Onset was 3-5 hours ago.   Frequency: regular.   Perceived severity is mild.   Fetal activity: Perceived fetal activity is normal.   Last perceived fetal movement was within the past hour.   Prenatal complications: no prenatal complications Prenatal Complications - Diabetes: none.   Dilation: 1 Effacement (%): Thick Exam by:: Raelyn Mora, CNM Blood pressure 112/64, pulse (!) 103, temperature 98.9 F (37.2 C), temperature source Oral, resp. rate 16, height 5\' 7"  (1.702 m), weight 61.3 kg, SpO2 100 %, currently breastfeeding. Maternal Exam:  Uterine Assessment: Contraction strength is mild.  Contraction frequency is regular.  Abdomen: Patient reports no abdominal tenderness. Fundal height is c/w dates.   Fetal presentation: vertex   Fetal Exam Fetal Monitor Review: Baseline rate: 150.  Variability: moderate (6-25 bpm).   Pattern: accelerations present and no decelerations.   Fetal State Assessment: Category I - tracings are normal.   Physical Exam Constitutional:      Appearance: Normal appearance.  HENT:     Head: Normocephalic and atraumatic.  Pulmonary:     Effort: Pulmonary effort is normal.  Abdominal:     Palpations: Abdomen is soft.  Musculoskeletal:  General: Normal range of motion.     Cervical back: Normal range of motion.  Skin:    General: Skin is warm and dry.  Neurological:     Mental Status: She is alert and oriented to person, place, and time.  Psychiatric:        Mood and Affect: Mood normal.        Behavior: Behavior normal.     Prenatal labs: ABO, Rh: --/--/O POS (07/02 1748) Antibody: NEG (07/02 1748) Rubella:  immune RPR:   NR HBsAg:   Negative HIV:   NR GBS:   Unknown  Assessment/Plan: 36yo G3P2002 at [redacted]w[redacted]d with threatened  PTL -BMZ -Magnesium through steroid maturity -CEFM, toco -SCDs -IVF -MFM u/s tomorrow to reassess [facility] -Anxiety-continue Citalopram -Hypothyroidism-continue synthroid -Anemia-FeSO4   Malania Gawthrop 11/29/2022, 7:53 PM

## 2022-11-29 NOTE — MAU Note (Signed)
.  Patty Castillo is a 37 y.o. at [redacted]w[redacted]d here in MAU reporting: CTX that began this morning and worsened while walking around a museum with her children. She reports she called her OB and they instructed her to drink water and rest. She reports her CTX's continued to worsen and are now every five minutes. Denies VB or LOF. +FM.  Next OB appointment on 7/26.  Onset of complaint: This morning Pain score:  4/10 mid to lower abdomen  FHT: 152 external Lab orders placed from triage: UA

## 2022-11-30 ENCOUNTER — Observation Stay (HOSPITAL_BASED_OUTPATIENT_CLINIC_OR_DEPARTMENT_OTHER): Payer: Commercial Managed Care - PPO

## 2022-11-30 DIAGNOSIS — E039 Hypothyroidism, unspecified: Secondary | ICD-10-CM

## 2022-11-30 DIAGNOSIS — O4700 False labor before 37 completed weeks of gestation, unspecified trimester: Secondary | ICD-10-CM

## 2022-11-30 DIAGNOSIS — Z3A24 24 weeks gestation of pregnancy: Secondary | ICD-10-CM

## 2022-11-30 DIAGNOSIS — O99283 Endocrine, nutritional and metabolic diseases complicating pregnancy, third trimester: Secondary | ICD-10-CM

## 2022-11-30 DIAGNOSIS — O99012 Anemia complicating pregnancy, second trimester: Secondary | ICD-10-CM | POA: Diagnosis not present

## 2022-11-30 DIAGNOSIS — O281 Abnormal biochemical finding on antenatal screening of mother: Secondary | ICD-10-CM

## 2022-11-30 DIAGNOSIS — O4702 False labor before 37 completed weeks of gestation, second trimester: Secondary | ICD-10-CM

## 2022-11-30 DIAGNOSIS — D649 Anemia, unspecified: Secondary | ICD-10-CM

## 2022-11-30 LAB — GC/CHLAMYDIA PROBE AMP (~~LOC~~) NOT AT ARMC
Chlamydia: NEGATIVE
Comment: NEGATIVE
Comment: NORMAL
Neisseria Gonorrhea: NEGATIVE

## 2022-11-30 MED ORDER — MAGNESIUM SULFATE 40 GM/1000ML IV SOLN: 2.0000 g/h | INTRAVENOUS | Status: AC

## 2022-11-30 NOTE — Consult Note (Signed)
MFM Note  Patty Castillo is a 37 year old gravida 3 para 2 currently at 24 weeks and 4 days.  She was admitted last evening due to preterm contractions with a positive fetal fibronectin test.    On admission, her cervix was 1 cm dilated and thick.  In the MAU, regular contractions every 2 to 3 minutes were noted.   Due to concerns regarding preterm labor due to her frequent contractions and a positive FFN test, she was admitted for observation, started on magnesium sulfate, and given a course of antenatal corticosteroids.  The patient has a history of 2 prior full-term uncomplicated vaginal deliveries.  She denies any prior LEEP or cone biopsies.  Her past medical history includes hypothyroidism and anxiety.  Overnight, the patient reports that her contractions have resolved.  Her fetal status is reassuring.  She denies any vaginal bleeding.  A low-lying placenta was noted on her ultrasound performed a few weeks ago in your office.  An ultrasound performed this morning shows that the fetus is in the breech presentation.  There was normal amniotic fluid noted.  The overall EFW of 2 pounds 1 ounces measures at the 99th percentile for her gestational age.  A normal-appearing posterior placenta is noted.  The previously noted low-lying placenta has resolved.    Her cervical length measured transabdominally was over 5 cm long without any signs of funneling.  The patient was advised that the fetal fibronectin is a protein that helps "glue" the amniotic sac to the lining of the uterus.  Usually, fetal fibronectin should not be detectable in vaginal secretions at between 22 to 35 weeks.  She was advised that a positive fetal fibronectin test has been associated with an increased risk of preterm labor and preterm delivery.  However, not all women with a positive fetal fibronectin test will have a preterm birth.  The patient was reassured that based on her history of 2 prior full-term deliveries and  as her cervical length measured over 5 cm long today without any signs of funneling, I believe that her risk of preterm birth is low at this time.  Due to her positive fetal fibronectin test, I agree with the administration of a complete course of antenatal corticosteroids.    Magnesium sulfate for tocolysis and fetal neuroprotection may be discontinued later this evening.  The patient should be observed in the hospital for 24 hours following the discontinuation of magnesium to determine if her contractions return.    Should she experience contractions following the discontinuation of magnesium, nifedipine or indocin for 48 hours may be started for tocolysis.  We will repeat another cervical length measurement in 2 days (on Friday).  Should she no longer experience contractions and her cervical length remains long and closed, outpatient management with close follow-up may be considered.    The patient is happy and agreeable with this management plan.    The patient and her husband stated that all of their questions were answered today.

## 2022-11-30 NOTE — Progress Notes (Signed)
Antepartum Progress Note   Patty Castillo is a 37 y.o. female G3P2002 that is admitted to [facility] for concern for preterm labor.  Overnight, she reports no acute events.  Admits fetal movement, denies contractions, denies leakage of fluid, denies vaginal bleeding.  History Dilation: 1 Effacement (%): Thick Exam by:: Raelyn Mora, CNM Blood pressure (!) 94/52, pulse 85, temperature 98.1 F (36.7 C), temperature source Oral, resp. rate 16, height 5\' 7"  (1.702 m), weight 61.3 kg, SpO2 98 %, currently breastfeeding. Exam  Physical Exam: Gen: Alert, well appearing, no distress Chest: nonlabored breathing CV: no peripheral edema Abdomen: gravid, soft and nontender Ext: no evidence of DVT  FHT: reactive and reassuring for gestational age.   Toco significantly improved.   Assessment/Plan: Admitted to [facility] for threatened PTL FFN positive Cervix 1/thick on 11/29/22 EFM and toco BMZ 7/2 - 7/3 (given 1807) Magnesium for neuroprotection until steroid complete Diet: regular DVT Ppx: SCDs Dispo:  Contractions much improved since admission.  [place] pending for low lying placenta evaluation.  Will continue to monitor contractions following discontinuation of magnesium.   Lyn Henri 11/30/2022, 7:24 AM

## 2022-11-30 NOTE — Progress Notes (Signed)
Subtle late deceleration noted at 18:12 for ~1 minute when patient was getting up to bathroom.  Excellent recovery and FHT has been reassuring since, appropriate for gestational age at 74 weeks.   Second dose of BMZ has been completed and magnesium due to discontinue soon.   Nilda Simmer

## 2022-12-01 LAB — CULTURE, BETA STREP (GROUP B ONLY)

## 2022-12-01 MED ORDER — SODIUM CHLORIDE 0.9% FLUSH
3.0000 mL | INTRAVENOUS | Status: DC | PRN
  Administered 2022-12-01: 3 mL via INTRAVENOUS

## 2022-12-01 MED ORDER — SODIUM CHLORIDE 0.9% FLUSH
3.0000 mL | Freq: Two times a day (BID) | INTRAVENOUS | Status: DC
  Administered 2022-12-01: 3 mL via INTRAVENOUS

## 2022-12-01 MED ORDER — HYDROXYZINE HCL 50 MG PO TABS
50.0000 mg | ORAL_TABLET | Freq: Three times a day (TID) | ORAL | Status: DC | PRN
  Administered 2022-12-01: 50 mg via ORAL
  Filled 2022-12-01: qty 1

## 2022-12-01 NOTE — Progress Notes (Signed)
Antepartum Progress Note  S: Doing well. No contractions, LOF, or VB. Feeling good FM.  O:    12/01/2022    8:37 AM 12/01/2022    3:08 AM 11/30/2022   11:14 PM  Vitals with BMI  Systolic 90 93 96  Diastolic 51 51 50  Pulse 78 82 87   Gen: NAD Abd: soft, ND, NT, gravid NST: reassuring and appropriate for GA  A/P: 36yo G3P2002 @ [redacted]w[redacted]d admitted for threatened PTL.  Threatened PTL - Cervix was 1/thick in MAU and FFN positive - Contractions completely resolved - s/p BM 7/2-3 and mag for neuroprotection - [place] 7/3 with CL 5cm, breech presentation - Appreciate Dr. Loretta Plume consult - plan for repeat CL tomorrow.   Anxiety - On home celexa - Will add on prn vistaril as she has been feeling increased anxiety since admission  Hypothyroid - Continue synthroid  Dispo: plan for [place] for CL tomorrow and discharge if stable.  Jule Economy, MD

## 2022-12-01 NOTE — Progress Notes (Signed)
Brief Progress Note  Notified by RN that patient with intermittent contractions on toco - patient reports feeling a few, no pain just tightening. Will continue to closely monitor. If become more regular or painful, will consider tocolysis.  Jule Economy, MD

## 2022-12-02 ENCOUNTER — Inpatient Hospital Stay (HOSPITAL_BASED_OUTPATIENT_CLINIC_OR_DEPARTMENT_OTHER): Payer: Commercial Managed Care - PPO

## 2022-12-02 DIAGNOSIS — O4702 False labor before 37 completed weeks of gestation, second trimester: Secondary | ICD-10-CM | POA: Diagnosis not present

## 2022-12-02 DIAGNOSIS — D649 Anemia, unspecified: Secondary | ICD-10-CM

## 2022-12-02 DIAGNOSIS — O99282 Endocrine, nutritional and metabolic diseases complicating pregnancy, second trimester: Secondary | ICD-10-CM | POA: Diagnosis not present

## 2022-12-02 DIAGNOSIS — Z3A24 24 weeks gestation of pregnancy: Secondary | ICD-10-CM

## 2022-12-02 DIAGNOSIS — E039 Hypothyroidism, unspecified: Secondary | ICD-10-CM

## 2022-12-02 DIAGNOSIS — O99012 Anemia complicating pregnancy, second trimester: Secondary | ICD-10-CM | POA: Diagnosis not present

## 2022-12-02 MED ORDER — CITALOPRAM HYDROBROMIDE 40 MG PO TABS: 40.0000 mg | ORAL_TABLET | Freq: Every day | ORAL | 3 refills | Status: AC

## 2022-12-02 NOTE — Progress Notes (Signed)
Discharge instructions and prescriptions reviewed with pt. Discussed signs and symptoms to report to the MD, upcoming appointments, and meds. Pt verbalizes understanding and has no questions or concerns at this time. Pt discharged home from hospital in stable condition.

## 2022-12-02 NOTE — Progress Notes (Signed)
Cervical length 4.5  Ok to go home  Follow up in 1 week cervical length

## 2022-12-02 NOTE — Discharge Summary (Signed)
Admission Diagnosis:  PTL  Discharge Diagnosis:  Same  Hospital Course:  37 year old G 3 P 2022 at 35 weeks with no history of PTL presents with uterine contractions. FFN + in triage. Admitted to antenatal for observation, magnesium and steroids.  No increase in activity after magnesium. Cervical length 5.1 on admission. 4.5 today. Past Medical History:  Diagnosis Date   Anxiety    Depression    Hx of varicella    Past Surgical History:  Procedure Laterality Date   NO PAST SURGERIES     Family History  Problem Relation Age of Onset   Cancer Mother        breast   Hypothyroidism Mother    Breast cancer Mother    Hypertension Father    Depression Sister    Hypothyroidism Maternal Aunt    Bipolar disorder Paternal Uncle    Diabetes Maternal Grandfather    Alzheimer's disease Maternal Grandmother    Cancer Paternal Grandfather    Anxiety disorder Paternal Grandmother    Mental illness Paternal Grandmother    Depression Paternal Grandmother    Cancer Paternal Grandmother        breast   Breast cancer Paternal Grandmother    Social History   Socioeconomic History   Marital status: Married    Spouse name: Not on file   Number of children: 2   Years of education: Not on file   Highest education level: Bachelor's degree (e.g., BA, AB, BS)  Occupational History   Not on file  Tobacco Use   Smoking status: Never   Smokeless tobacco: Never  Vaping Use   Vaping Use: Never used  Substance and Sexual Activity   Alcohol use: No   Drug use: No   Sexual activity: Yes    Birth control/protection: None  Other Topics Concern   Not on file  Social History Narrative   Not on file   Social Determinants of Health   Financial Resource Strain: Not on file  Food Insecurity: No Food Insecurity (11/29/2022)   Hunger Vital Sign    Worried About Running Out of Food in the Last Year: Never true    Ran Out of Food in the Last Year: Never true  Transportation Needs: No  Transportation Needs (11/29/2022)   PRAPARE - Administrator, Civil Service (Medical): No    Lack of Transportation (Non-Medical): No  Physical Activity: Not on file  Stress: Not on file  Social Connections: Not on file     BP (!) 86/47 (BP Location: Left Arm) Comment: RN present in room  Pulse 73   Temp 98.3 F (36.8 C) (Oral)   Resp 16   Ht 5\' 7"  (1.702 m)   Wt 61.3 kg   SpO2 100%   BMI 21.16 kg/m  No results found for this or any previous visit (from the past 24 hour(s)). General alert and oriented Lung CTA Car RRR  Uterus is wnl Soft and non tender FHR is reactive    Patient discharged home with instructions for bedrest and advised to follow up in office in one week for cervical length.

## 2022-12-02 NOTE — Progress Notes (Signed)
Patient noticing only some mild tightening. Good fetal movement.  BP (!) 86/47 (BP Location: Left Arm) Comment: RN present in room  Pulse 73   Temp 98.3 F (36.8 C) (Oral)   Resp 16   Ht 5\' 7"  (1.702 m)   Wt 61.3 kg   SpO2 100%   BMI 21.16 kg/m   Uterus is soft and non tender   About to go for cervical length ultrasound   Will evaluate scan  If length is stable will discharge home on bedrest and plan would be to follow up in one week for cervical length in our office.

## 2022-12-07 ENCOUNTER — Telehealth (HOSPITAL_COMMUNITY): Payer: Commercial Managed Care - PPO | Admitting: Psychiatry

## 2022-12-07 ENCOUNTER — Encounter (HOSPITAL_COMMUNITY): Payer: Self-pay

## 2022-12-19 LAB — OB RESULTS CONSOLE HIV ANTIBODY (ROUTINE TESTING): HIV: NONREACTIVE

## 2022-12-19 LAB — OB RESULTS CONSOLE HEPATITIS B SURFACE ANTIGEN: Hepatitis B Surface Ag: NEGATIVE

## 2022-12-19 LAB — OB RESULTS CONSOLE RUBELLA ANTIBODY, IGM: Rubella: IMMUNE

## 2022-12-19 LAB — OB RESULTS CONSOLE RPR: RPR: NONREACTIVE

## 2023-01-03 LAB — OB RESULTS CONSOLE GBS: GBS: NEGATIVE

## 2023-03-14 LAB — OB RESULTS CONSOLE GBS: GBS: NEGATIVE

## 2023-03-18 ENCOUNTER — Inpatient Hospital Stay (HOSPITAL_COMMUNITY)
Admission: RE | Admit: 2023-03-18 | Discharge: 2023-03-20 | DRG: 806 | Disposition: A | Discharge status: Home / Self Care | Payer: Commercial Managed Care - PPO | Attending: Obstetrics | Admitting: Obstetrics

## 2023-03-18 ENCOUNTER — Encounter (HOSPITAL_COMMUNITY): Payer: Self-pay | Admitting: Obstetrics

## 2023-03-18 DIAGNOSIS — Z818 Family history of other mental and behavioral disorders: Secondary | ICD-10-CM

## 2023-03-18 DIAGNOSIS — O9081 Anemia of the puerperium: Secondary | ICD-10-CM | POA: Diagnosis not present

## 2023-03-18 DIAGNOSIS — Z8249 Family history of ischemic heart disease and other diseases of the circulatory system: Secondary | ICD-10-CM

## 2023-03-18 DIAGNOSIS — Z833 Family history of diabetes mellitus: Secondary | ICD-10-CM

## 2023-03-18 DIAGNOSIS — F419 Anxiety disorder, unspecified: Secondary | ICD-10-CM | POA: Diagnosis present

## 2023-03-18 DIAGNOSIS — Z820 Family history of epilepsy and other diseases of the nervous system: Secondary | ICD-10-CM

## 2023-03-18 DIAGNOSIS — O99344 Other mental disorders complicating childbirth: Secondary | ICD-10-CM | POA: Diagnosis present

## 2023-03-18 DIAGNOSIS — D62 Acute posthemorrhagic anemia: Secondary | ICD-10-CM | POA: Diagnosis not present

## 2023-03-18 DIAGNOSIS — E039 Hypothyroidism, unspecified: Secondary | ICD-10-CM | POA: Diagnosis present

## 2023-03-18 DIAGNOSIS — O872 Hemorrhoids in the puerperium: Secondary | ICD-10-CM | POA: Diagnosis not present

## 2023-03-18 DIAGNOSIS — O26893 Other specified pregnancy related conditions, third trimester: Secondary | ICD-10-CM | POA: Diagnosis present

## 2023-03-18 DIAGNOSIS — Z803 Family history of malignant neoplasm of breast: Secondary | ICD-10-CM

## 2023-03-18 DIAGNOSIS — Z3A40 40 weeks gestation of pregnancy: Secondary | ICD-10-CM

## 2023-03-18 DIAGNOSIS — O99284 Endocrine, nutritional and metabolic diseases complicating childbirth: Principal | ICD-10-CM | POA: Diagnosis present

## 2023-03-18 MED ORDER — FLEET ENEMA RE ENEM: 1.0000 | ENEMA | RECTAL | Status: DC | PRN

## 2023-03-18 MED ORDER — LACTATED RINGERS IV SOLN: 500.0000 mL | INTRAVENOUS | Status: DC | PRN

## 2023-03-18 MED ORDER — OXYTOCIN BOLUS FROM INFUSION: 333.0000 mL | Freq: Once | INTRAVENOUS | Status: DC

## 2023-03-18 MED ORDER — COCONUT OIL OIL
1.0000 | TOPICAL_OIL | Status: DC | PRN
  Administered 2023-03-20: 1 via TOPICAL

## 2023-03-18 MED ORDER — ONDANSETRON HCL 4 MG/2ML IJ SOLN: 4.0000 mg | INTRAMUSCULAR | Status: DC | PRN

## 2023-03-18 MED ORDER — OXYTOCIN-SODIUM CHLORIDE 30-0.9 UT/500ML-% IV SOLN: 2.5000 [IU]/h | INTRAVENOUS | Status: DC

## 2023-03-18 MED ORDER — WITCH HAZEL-GLYCERIN EX PADS
1.0000 | MEDICATED_PAD | CUTANEOUS | Status: DC | PRN
  Administered 2023-03-19: 1 via TOPICAL

## 2023-03-18 MED ORDER — SOD CITRATE-CITRIC ACID 500-334 MG/5ML PO SOLN: 30.0000 mL | ORAL | Status: DC | PRN

## 2023-03-18 MED ORDER — ONDANSETRON HCL 4 MG/2ML IJ SOLN: 4.0000 mg | Freq: Four times a day (QID) | INTRAMUSCULAR | Status: DC | PRN

## 2023-03-18 MED ORDER — SENNOSIDES-DOCUSATE SODIUM 8.6-50 MG PO TABS
2.0000 | ORAL_TABLET | Freq: Every day | ORAL | Status: DC
Start: 2023-03-19 — End: 2023-03-20
  Administered 2023-03-19 – 2023-03-20 (×2): 2 via ORAL
  Filled 2023-03-18 (×2): qty 2

## 2023-03-18 MED ORDER — LIDOCAINE HCL (PF) 1 % IJ SOLN
30.0000 mL | INTRAMUSCULAR | Status: AC | PRN
  Administered 2023-03-18: 30 mL via SUBCUTANEOUS
  Filled 2023-03-18: qty 30

## 2023-03-18 MED ORDER — AMMONIA AROMATIC IN INHA
RESPIRATORY_TRACT | Status: AC
  Filled 2023-03-18: qty 10

## 2023-03-18 MED ORDER — LEVOTHYROXINE SODIUM 50 MCG PO TABS
25.0000 ug | ORAL_TABLET | Freq: Every day | ORAL | Status: DC
  Filled 2023-03-18 (×2): qty 1

## 2023-03-18 MED ORDER — IBUPROFEN 600 MG PO TABS
600.0000 mg | ORAL_TABLET | Freq: Four times a day (QID) | ORAL | Status: DC
  Administered 2023-03-19 – 2023-03-20 (×5): 600 mg via ORAL
  Filled 2023-03-18 (×6): qty 1

## 2023-03-18 MED ORDER — OXYCODONE-ACETAMINOPHEN 5-325 MG PO TABS: 2.0000 | ORAL_TABLET | ORAL | Status: DC | PRN

## 2023-03-18 MED ORDER — PRENATAL MULTIVITAMIN CH
1.0000 | ORAL_TABLET | Freq: Every day | ORAL | Status: DC
  Filled 2023-03-18: qty 1

## 2023-03-18 MED ORDER — ACETAMINOPHEN 325 MG PO TABS: 650.0000 mg | ORAL_TABLET | ORAL | Status: DC | PRN

## 2023-03-18 MED ORDER — BENZOCAINE-MENTHOL 20-0.5 % EX AERO
1.0000 | INHALATION_SPRAY | CUTANEOUS | Status: DC | PRN
  Filled 2023-03-18: qty 56

## 2023-03-18 MED ORDER — LACTATED RINGERS IV SOLN: INTRAVENOUS | Status: DC

## 2023-03-18 MED ORDER — DIPHENHYDRAMINE HCL 25 MG PO CAPS: 25.0000 mg | ORAL_CAPSULE | Freq: Four times a day (QID) | ORAL | Status: DC | PRN

## 2023-03-18 MED ORDER — DIBUCAINE (PERIANAL) 1 % EX OINT: 1.0000 | TOPICAL_OINTMENT | CUTANEOUS | Status: DC | PRN

## 2023-03-18 MED ORDER — CITALOPRAM HYDROBROMIDE 20 MG PO TABS
40.0000 mg | ORAL_TABLET | Freq: Every day | ORAL | Status: DC
  Filled 2023-03-18: qty 1

## 2023-03-18 MED ORDER — ACETAMINOPHEN 500 MG PO TABS
1000.0000 mg | ORAL_TABLET | Freq: Four times a day (QID) | ORAL | Status: DC
  Administered 2023-03-19 – 2023-03-20 (×3): 1000 mg via ORAL
  Filled 2023-03-18 (×3): qty 2

## 2023-03-18 MED ORDER — OXYCODONE-ACETAMINOPHEN 5-325 MG PO TABS: 1.0000 | ORAL_TABLET | ORAL | Status: DC | PRN

## 2023-03-18 MED ORDER — ONDANSETRON HCL 4 MG PO TABS
4.0000 mg | ORAL_TABLET | ORAL | Status: DC | PRN
Start: 2023-03-18 — End: 2023-03-20

## 2023-03-18 MED ORDER — SIMETHICONE 80 MG PO CHEW: 80.0000 mg | CHEWABLE_TABLET | ORAL | Status: DC | PRN

## 2023-03-18 NOTE — MAU Note (Signed)
Patty Castillo is a 37 y.o. at [redacted]w[redacted]d here in MAU reporting: pt reports feeling like she needed to push in the lobby. Pt brought back to room 121. Cervix 7/90/-1 vertex by R. Arita Miss CNM. Denies any LOF or VB. Pt wanting water birth. +FM  Pain score: 10 FHT:140 Lab orders placed from triage:

## 2023-03-18 NOTE — Progress Notes (Addendum)
This RN spoke with patient about her preferences for plan of care for the evening. Pt requested that no invasive procedures such as vaccinations or PKU be performed on the baby but is compliant with congenital heart screen, hearing screen, and bilirubin level collection when applicable. Pt also requested that her checks be every five hours instead of four and medications to be given at request only so as to allow time for rest. Pt is agreeable to fundal rubs and vital signs. Pt was educated on the need for CBC in the morning to determine hemoglobin levels so that we may treat any low levels, especially considering her episode of passing out. Pt is compliant with CBC and RPR being drawn at 7am tomorrow. She requested the change from 5am to 7am. Pt was instructed not to get out of bed without assistance and voiced understanding.

## 2023-03-18 NOTE — Progress Notes (Addendum)
AT 1945 this RN was scooting patient to the side of the bed to transfer to wheelchair per natural birth. Patient stated she was going to pass out and passed out in this RN's arms. Father of the baby at bedside holding baby. This RN called charge Sandford Craze, RN and Kathi Simpers, RN for second hands. Amonia and Sprite were given. Patient refused IV. BP was 97/80 then 114/59. Pulse was 121 and 115. Patient stated she felt fine now. Patient was transferred to MB bed. Fundus firm and bleeding scant. Patient educated on PO hydration per no IV. Patient transferred tp MB and report given to MB nurse Shellia Cleverly, MD.

## 2023-03-18 NOTE — Progress Notes (Signed)
Pt refusing labs currently. States "I don't feel like it right now". Pt educated on need for CBC and T&S for starting hemoglobin and blood type if transfusion were needed.   Luna Fuse

## 2023-03-18 NOTE — H&P (Signed)
Patty Castillo is a 37 y.o. G3P2002 at 40.0 w/hx hypothyroidism, anxiety, presenting for active labor. Patient with regular contractions a few minutes apart for last few hours. Unsure if she broke her water. Feels urge to push. OB History     Gravida  3   Para  2   Term  2   Preterm      AB      Living  2      SAB      IAB      Ectopic      Multiple  0   Live Births  2          Past Medical History:  Diagnosis Date   Anxiety    Depression    Hx of varicella    Past Surgical History:  Procedure Laterality Date   NO PAST SURGERIES     Family History: family history includes Alzheimer's disease in her maternal grandmother; Anxiety disorder in her paternal grandmother; Bipolar disorder in her paternal uncle; Breast cancer in her mother and paternal grandmother; Cancer in her mother, paternal grandfather, and paternal grandmother; Depression in her paternal grandmother and sister; Diabetes in her maternal grandfather; Hypertension in her father; Hypothyroidism in her maternal aunt and mother; Mental illness in her paternal grandmother. Social History:  reports that she has never smoked. She has never used smokeless tobacco. She reports that she does not drink alcohol and does not use drugs.     Maternal Diabetes: No Genetic Screening: Declined Maternal Ultrasounds/Referrals: Normal Fetal Ultrasounds or other Referrals:  None Maternal Substance Abuse:  No Significant Maternal Medications:  Meds include: Other: Synthroid, Celexa Significant Maternal Lab Results:  Group B Strep negative Number of Prenatal Visits:greater than 3 verified prenatal visits Maternal Vaccinations: Declined Tdap Other Comments:  None  Review of Systems History Dilation: 7 Effacement (%): 90 Station: -1 Exam by:: Patty Castillo Patty Blood pressure 98/68, pulse (!) 104, temperature 98.1 F (36.7 C), temperature source Oral, resp. rate 18, currently breastfeeding. Exam Physical  Exam Constitutional:      Appearance: Normal appearance. She is normal weight.  HENT:     Head: Normocephalic and atraumatic.  Cardiovascular:     Rate and Rhythm: Normal rate and regular rhythm.  Pulmonary:     Effort: Pulmonary effort is normal.  Abdominal:     Palpations: Abdomen is soft.     Tenderness: There is no abdominal tenderness.  Neurological:     Mental Status: She is alert.     Prenatal labs: ABO, Rh: --/--/O POS (07/02 1748) Antibody: NEG (07/02 1748) Rubella:   RPR:    HBsAg:    HIV:    GBS:     Assessment/Plan: Admit to L&D for active labor, membranes intact. Strongly desires low intervention, water birth. Patty Castillo aware and on her way to hospital.    Patty Castillo 03/18/2023, 3:37 PM

## 2023-03-19 LAB — CBC WITH DIFFERENTIAL/PLATELET
Abs Immature Granulocytes: 0.18 10*3/uL — ABNORMAL HIGH (ref 0.00–0.07)
Basophils Absolute: 0 10*3/uL (ref 0.0–0.1)
Basophils Relative: 0 %
Eosinophils Absolute: 0 10*3/uL (ref 0.0–0.5)
Eosinophils Relative: 0 %
HCT: 28 % — ABNORMAL LOW (ref 36.0–46.0)
Hemoglobin: 9.7 g/dL — ABNORMAL LOW (ref 12.0–15.0)
Immature Granulocytes: 1 %
Lymphocytes Relative: 7 %
Lymphs Abs: 1.3 10*3/uL (ref 0.7–4.0)
MCH: 32.8 pg (ref 26.0–34.0)
MCHC: 34.6 g/dL (ref 30.0–36.0)
MCV: 94.6 fL (ref 80.0–100.0)
Monocytes Absolute: 1.3 10*3/uL — ABNORMAL HIGH (ref 0.1–1.0)
Monocytes Relative: 6 %
Neutro Abs: 17.5 10*3/uL — ABNORMAL HIGH (ref 1.7–7.7)
Neutrophils Relative %: 86 %
Platelets: 193 10*3/uL (ref 150–400)
RBC: 2.96 MIL/uL — ABNORMAL LOW (ref 3.87–5.11)
RDW: 13.4 % (ref 11.5–15.5)
WBC: 20.4 10*3/uL — ABNORMAL HIGH (ref 4.0–10.5)
nRBC: 0 % (ref 0.0–0.2)

## 2023-03-19 LAB — RPR: RPR Ser Ql: NONREACTIVE

## 2023-03-19 MED ORDER — MENTHOL 3 MG MT LOZG
1.0000 | LOZENGE | OROMUCOSAL | Status: DC | PRN
  Administered 2023-03-19: 3 mg via ORAL
  Filled 2023-03-19: qty 9

## 2023-03-19 NOTE — Lactation Note (Signed)
This note was copied from a baby's chart. Lactation Consultation Note  Patient Name: Patty Castillo Date: 03/19/2023 Age:35 hours Reason for consult: Initial assessment;Term  P3, Mother is experienced with breastfeeding.  She does not have any questions or concerns at this time.  Baby at breast after recent 30 min feeding.  Mother's states her nipple is slightly tender from initial latch and will apply ebm for soreness.  She knows to ask coconut oil if needed. Feed on demand with cues.  Goal 8-12+ times per day after first 24 hrs.  Place baby STS if not cueing. Will call for help as needed.   Maternal Data Has patient been taught Hand Expression?: Yes Does the patient have breastfeeding experience prior to this delivery?: Yes How long did the patient breastfeed?: 2 years  Feeding Mother's Current Feeding Choice: Breast Milk  Interventions Interventions: Education;LC Services brochure  Consult Status Consult Status: PRN    Hardie Pulley  RN IBCLC 03/19/2023, 8:19 AM

## 2023-03-19 NOTE — Social Work (Signed)
MOB was referred for history of depression/anxiety. ?* Referral screened out by Clinical Social Worker because none of the following criteria appear to apply: ?~ History of anxiety/depression during this pregnancy, or of post-partum depression following prior delivery. ?~ Diagnosis of anxiety and/or depression within last 3 years ?OR ?* MOB's symptoms currently being treated with medication and/or therapy. ? ?Please contact the Clinical Social Worker if needs arise, by MOB request, or if MOB scores greater than 9/yes to question 10 on Edinburgh Postpartum Depression Screen.  ? ?Yong Wahlquist, LCSW ?Clinical Social Worker ? ?

## 2023-03-19 NOTE — Lactation Note (Signed)
This note was copied from a baby's chart. Lactation Consultation Note  Patient Name: Girl Melena Dovey [id number] Date: 03/19/2023 Age:35 hours  Asked RN earlier to ask mom if she wanted to see Lactation while at hospital. RN told LC that mom was really sick feeling and passed out earlier but not feeling good.   Maternal Data    Feeding    LATCH Score                    Lactation Tools Discussed/Used    Interventions    Discharge    Consult Status      Charyl Dancer 03/19/2023, 4:14 AM

## 2023-03-19 NOTE — Progress Notes (Addendum)
Postpartum Progress Note  PPD#1 s/p SVD  S: Patient seen and examined at bedside. Reports feeling overall well. Pain well controlled, ambulating, tolerating regular diet, voiding without issue. Denies fever, chills, chest pain, shortness of breath. Reports continued cramping pain, 5/10 at it's worst with meds. Also with significant hemorrhoids, discussed not straining or pushing.   Feeding: plans to breastfeed Circ: N/A - female neonate BCM: Unsure  O:  Vitals:   03/19/23 0030 03/19/23 0613  BP: 106/71 108/71  Pulse: 99 82  Resp: 18 18  Temp: 99.6 F (37.6 C) 98.2 F (36.8 C)  SpO2: 99% 99%    PE:  GA: well appearing, NAD CV: RRR, normal S1, S2 Lungs: CTAB Abd: soft, appropriately tender, fundus firm below umbilicus Peri: moderate lochia Ext: no TTP, +1 non-pitting edema  Labs:  Lab Results  Component Value Date   WBC 20.4 (H) 03/19/2023   HGB 9.7 (L) 03/19/2023   HCT 28.0 (L) 03/19/2023   MCV 94.6 03/19/2023   PLT 193 03/19/2023   Lab Results  Component Value Date   CREATININE 0.73 02/17/2016    A/P:  37 y.o.yo [id number] PPD#1 s/p SVD (EBL ) with hypothyroidism and anxiety, doing well and progressing appropriately. Vitals within normal limits. Physical exam benign. Labs reassuring - appropriate drop in Hgb, elevated WBC, no infectious symptoms. Will repeat CBC tomorrow. Anticipate DC tomorrow. Plan as follows:   #Routine OB - Regular diet, HLIV - ERAS for pain control  - Rh positive - DVT ppx: ambulating - BCM: Unsure  #Neonate - N/A - female neonate -plans to breastfeed    Marlene Bast, MD

## 2023-03-19 NOTE — Lactation Note (Signed)
This note was copied from a baby's chart. Lactation Consultation Note  Patient Name: Girl Jazzlynne Amis [id number] Date: 03/19/2023 Age:35 hours Reason for consult: Follow-up assessment;Term;Nipple pain/trauma  P3- RN requested for LC to check on MOB due to MOB's nipples having breakdown. When LC asked MOB how her nipples are doing, MOB told LC that they are fine. LC offered MOB some coconut oil for the breakdown, but MOB declined. LC encouraged MOB to call lactation for further assistance if needed.  Maternal Data Does the patient have breastfeeding experience prior to this delivery?: Yes  Feeding Mother's Current Feeding Choice: Breast Milk  Interventions Interventions: Breast feeding basics reviewed;Education  Discharge Discharge Education: Warning signs for feeding baby  Consult Status Consult Status: Follow-up Date: 03/20/23 Follow-up type: In-patient    Dema Severin BS, IBCLC 03/19/2023, 2:43 PM

## 2023-03-20 ENCOUNTER — Other Ambulatory Visit: Payer: Self-pay

## 2023-03-20 LAB — CBC WITH DIFFERENTIAL/PLATELET
Abs Immature Granulocytes: 0.07 10*3/uL (ref 0.00–0.07)
Basophils Absolute: 0.1 10*3/uL (ref 0.0–0.1)
Basophils Relative: 0 %
Eosinophils Absolute: 0.2 10*3/uL (ref 0.0–0.5)
Eosinophils Relative: 2 %
HCT: 21.6 % — ABNORMAL LOW (ref 36.0–46.0)
Hemoglobin: 7.2 g/dL — ABNORMAL LOW (ref 12.0–15.0)
Immature Granulocytes: 1 %
Lymphocytes Relative: 17 %
Lymphs Abs: 2.1 10*3/uL (ref 0.7–4.0)
MCH: 32.6 pg (ref 26.0–34.0)
MCHC: 33.3 g/dL (ref 30.0–36.0)
MCV: 97.7 fL (ref 80.0–100.0)
Monocytes Absolute: 0.9 10*3/uL (ref 0.1–1.0)
Monocytes Relative: 7 %
Neutro Abs: 9 10*3/uL — ABNORMAL HIGH (ref 1.7–7.7)
Neutrophils Relative %: 73 %
Platelets: 154 10*3/uL (ref 150–400)
RBC: 2.21 MIL/uL — ABNORMAL LOW (ref 3.87–5.11)
RDW: 13.9 % (ref 11.5–15.5)
WBC: 12.3 10*3/uL — ABNORMAL HIGH (ref 4.0–10.5)
nRBC: 0 % (ref 0.0–0.2)

## 2023-03-20 MED ORDER — POLYSACCHARIDE IRON COMPLEX 150 MG PO CAPS: 150.0000 mg | ORAL_CAPSULE | Freq: Every day | ORAL | Status: DC

## 2023-03-20 MED ORDER — IRON SUCROSE 500 MG IVPB - SIMPLE MED
500.0000 mg | Freq: Once | INTRAVENOUS | Status: DC
  Filled 2023-03-20: qty 275

## 2023-03-20 MED ORDER — IBUPROFEN 600 MG PO TABS: 600.0000 mg | ORAL_TABLET | Freq: Four times a day (QID) | ORAL | 0 refills | Status: AC

## 2023-03-20 MED ORDER — ACETAMINOPHEN 500 MG PO TABS: 1000.0000 mg | ORAL_TABLET | Freq: Four times a day (QID) | ORAL | 0 refills | Status: AC

## 2023-03-20 MED ORDER — SODIUM CHLORIDE 0.9 % IV SOLN
500.0000 mg | Freq: Once | INTRAVENOUS | Status: AC
  Administered 2023-03-20: 500 mg via INTRAVENOUS
  Filled 2023-03-20: qty 25

## 2023-03-20 MED ORDER — SENNOSIDES-DOCUSATE SODIUM 8.6-50 MG PO TABS: 2.0000 | ORAL_TABLET | Freq: Every day | ORAL | Status: AC

## 2023-03-20 MED ORDER — POLYSACCHARIDE IRON COMPLEX 150 MG PO CAPS: 150.0000 mg | ORAL_CAPSULE | Freq: Every day | ORAL | 0 refills | Status: AC

## 2023-03-20 NOTE — Lactation Note (Signed)
This note was copied from a baby's chart. Lactation Consultation Note  Patient Name: Patty Castillo [id number] Date: 03/20/2023 Age:35 hours Reason for consult: Follow-up assessment;Nipple pain/trauma  P3, Mother's nipples pink and sore. Observed latch.  Mother has an ample milk supply with frequent swallows heard and observed.  Mother leaning forward to latch baby.  Suggest bringing baby to her by increasing height of breastfeeding pillow with another pillow underneath.  Repositioned mother's hand further back to achieve a deeper latch. Infant does have a tight upper lip which should soften over time. Provided mother shells to wear with either ebm or coconut oil.  Reviewed how to latch in laid back position.  Mother is working hard, breastfeeding often.  Maternal Data Has patient been taught Hand Expression?: Yes  Feeding Mother's Current Feeding Choice: Breast Milk  LATCH Score Latch: Grasps breast easily, tongue down, lips flanged, rhythmical sucking.  Audible Swallowing: A few with stimulation  Type of Nipple: Everted at rest and after stimulation  Comfort (Breast/Nipple): Filling, red/small blisters or bruises, mild/mod discomfort  Hold (Positioning): Assistance needed to correctly position infant at breast and maintain latch.  LATCH Score: 7   Lactation Tools Discussed/Used Tools: Coconut oil;Shells  Interventions Interventions: Education;Shells  Discharge Discharge Education: Engorgement and breast care;Warning signs for feeding baby  Consult Status Consult Status: Complete Date: 03/20/23    [name] [facility] 03/20/2023, 10:09 AM

## 2023-03-20 NOTE — Discharge Summary (Signed)
Postpartum Discharge Summary  Date of Service updated 03/20/23     Patient Name: Patty Castillo DOB: Jul 31, 1985 MRN: [id number]  Date of admission: 03/18/2023 Delivery date:03/18/2023 Delivering provider: Dorisann Frames K Date of discharge: 03/20/2023  Admitting diagnosis: Normal labor [O80, Z37.9] Intrauterine pregnancy: [redacted]w[redacted]d     Secondary diagnosis:  Principal Problem:   Postpartum care following vaginal delivery 10/18 Active Problems:   SVD (spontaneous vaginal delivery)   Normal labor   Second degree perineal laceration  Additional problems: n/a    Discharge diagnosis: Term Pregnancy Delivered and Anemia                                              Post partum procedures: IV Venofer on PPD2 Augmentation: N/A Complications: None  Hospital course: Onset of Labor With Vaginal Delivery      37 y.o. yo G3P3003 at [redacted]w[redacted]d was admitted in Active Labor on 03/18/2023. Labor course was complicated by none  Membrane Rupture Time/Date: 4:02 PM,03/18/2023  Delivery Method:Vaginal, Spontaneous Operative Delivery:N/A Episiotomy: None Lacerations:  2nd degree Patient had a postpartum course complicated by acute on chronic blood loss anemia. PPD2 Hemoglobin dropped to 7.2, down from 12.3 on admission. Ambulating without dizziness but significant fatigue. She received IV Venofer on PPD2 and advised to start PO Iron supplement upon discharge. She is ambulating, tolerating a regular diet, passing flatus, and urinating well. Patient is discharged home in stable condition on 03/20/23.  Newborn Data: Birth date:03/18/2023 Birth time:5:24 PM Gender:Female Living status:Living Apgars:7 ,9  Weight:4706 g  Magnesium Sulfate received: No BMZ received: No Rhophylac:N/A MMR:No T-DaP:? Immunizations administered: Immunization History  Administered Date(s) Administered   Influenza-Unspecified 03/17/2015   Tdap 04/15/2015    Physical exam  Vitals:   03/19/23 1428 03/19/23  2056 03/20/23 0500 03/20/23 0925  BP: (!) 92/59 96/63 (!) 93/58   Pulse: 78 92 87   Resp:  16 16   Temp: 98.8 F (37.1 C) 98.5 F (36.9 C) 98.4 F (36.9 C)   TempSrc: Oral     SpO2:  99%    Weight:    60.8 kg  Height:    5\' 7"  (1.702 m)   General: alert and no distress Lochia: appropriate Uterine Fundus: firm Incision: N/A DVT Evaluation: No evidence of DVT seen on physical exam. Labs: Lab Results  Component Value Date   WBC 12.3 (H) 03/20/2023   HGB 7.2 (L) 03/20/2023   HCT 21.6 (L) 03/20/2023   MCV 97.7 03/20/2023   PLT 154 03/20/2023      Latest Ref Rng & Units 02/17/2016    5:46 PM  CMP  Glucose 65 - 99 mg/dL 696   BUN 6 - 20 mg/dL 13   Creatinine 2.95 - 1.00 mg/dL 2.84   Sodium 132 - 440 mmol/L 140   Potassium 3.5 - 5.1 mmol/L 3.7   Chloride 101 - 111 mmol/L 105   CO2 22 - 32 mmol/L 28   Calcium 8.9 - 10.3 mg/dL 9.2   Total Protein 6.5 - 8.1 g/dL 6.5   Total Bilirubin 0.3 - 1.2 mg/dL 0.3   Alkaline Phos 38 - 126 U/L 80   AST 15 - 41 U/L 16   ALT 14 - 54 U/L 9    Edinburgh Score:    03/20/2023    5:40 AM  Edinburgh Postnatal Depression Scale Screening Tool  I have been able to laugh and see the funny side of things. 0  I have looked forward with enjoyment to things. 0  I have blamed myself unnecessarily when things went wrong. 1  I have been anxious or worried for no good reason. 1  I have felt scared or panicky for no good reason. 0  Things have been getting on top of me. 0  I have been so unhappy that I have had difficulty sleeping. 0  I have felt sad or miserable. 1  I have been so unhappy that I have been crying. 0  The thought of harming myself has occurred to me. 0  Edinburgh Postnatal Depression Scale Total 3      After visit meds:  Allergies as of 03/20/2023       Reactions   Vibra-tab [doxycycline] Nausea And Vomiting   Latex Hives, Rash        Medication List     TAKE these medications    acetaminophen 500 MG  tablet Commonly known as: TYLENOL Take 2 tablets (1,000 mg total) by mouth every 6 (six) hours.   citalopram 40 MG tablet Commonly known as: CELEXA Take 1 tablet (40 mg total) by mouth daily at 8 pm.   ibuprofen 600 MG tablet Commonly known as: ADVIL Take 1 tablet (600 mg total) by mouth every 6 (six) hours.   iron polysaccharides 150 MG capsule Commonly known as: NIFEREX Take 1 capsule (150 mg total) by mouth daily.   levothyroxine 25 MCG tablet Commonly known as: SYNTHROID Take 25 mcg by mouth daily before breakfast.   PRENATAL PO Take 1 tablet by mouth daily.   senna-docusate 8.6-50 MG tablet Commonly known as: Senokot-S Take 2 tablets by mouth daily. Start taking on: March 21, 2023               Discharge Care Instructions  (From admission, onward)           Start     Ordered   03/20/23 0000  Discharge wound care:       Comments: Sitz baths 2 times /day with warm water x 1 week. May add herbals: 1 ounce dried comfrey leaf* 1 ounce calendula flowers 1 ounce lavender flowers  Supplies can be found online at [employer] sources at [employer], [employer]  1/2 ounce dried uva ursi leaves 1/2 ounce witch hazel blossoms (if you can find them) 1/2 ounce dried sage leaf 1/2 cup sea salt Directions: Bring 2 quarts of water to a boil. Turn off heat, and place 1 ounce (approximately 1 large handful) of the above mixed herbs (not the salt) into the pot. Steep, covered, for 30 minutes.  Strain the liquid well with a fine mesh strainer, and discard the herb material. Add 2 quarts of liquid to the tub, along with the 1/2 cup of salt. This medicinal liquid can also be made into compresses and peri-rinses.   03/20/23 1134   03/20/23 0000  Discharge wound care:       Comments: Sitz baths 2 times /day with warm water x 1 week. May add herbals: 1 ounce dried comfrey leaf* 1 ounce calendula flowers 1 ounce lavender flowers  Supplies can be found  online at [employer] sources at [employer], [employer]  1/2 ounce dried uva ursi leaves 1/2 ounce witch hazel blossoms (if you can find them) 1/2 ounce dried sage leaf 1/2 cup sea salt Directions: Bring 2 quarts of  water to a boil. Turn off heat, and place 1 ounce (approximately 1 large handful) of the above mixed herbs (not the salt) into the pot. Steep, covered, for 30 minutes.  Strain the liquid well with a fine mesh strainer, and discard the herb material. Add 2 quarts of liquid to the tub, along with the 1/2 cup of salt. This medicinal liquid can also be made into compresses and peri-rinses.   03/20/23 1139             Discharge home in stable condition Infant Feeding: Breast Infant Disposition:home with mother Discharge instruction: per After Visit Summary and Postpartum booklet. Activity: Advance as tolerated. Pelvic rest for 6 weeks.  Diet: routine diet Anticipated Birth Control: Unsure Postpartum Appointment:6 weeks Additional Postpartum F/U:  n/a Future Appointments:No future appointments. Follow up Visit:  Patient to follow up with CNM's at [facility] for 6 week postpartum visit or sooner as needed.    03/20/2023 Lindzy Rupert A Medardo Hassing, DO

## 2023-03-20 NOTE — Lactation Note (Signed)
This note was copied from a baby's chart. Lactation Consultation Note  Patient Name: Patty Castillo [id number] Date: 03/20/2023 Age:35 hours Reason for consult: Follow-up assessment  P3, 8.3% weight loss.  2.53% in the last 24 hours.  6 voids and 4 stools in the last 24 hours. Mother recently finished breastfeeding baby for 20 min. Reviewed engorgement care and monitoring voids/stools. Mother will receive blood transfusion this morning prior to discharge.   Maternal Data Has patient been taught Hand Expression?: Yes  Feeding Mother's Current Feeding Choice: Breast Milk Interventions Interventions: Education  Discharge Discharge Education: Engorgement and breast care;Warning signs for feeding baby  Consult Status Consult Status: Complete Date: 03/20/23    Patty Byes Boschen  RN IBCLC 03/20/2023, 8:39 AM

## 2023-04-10 ENCOUNTER — Telehealth (HOSPITAL_COMMUNITY): Payer: Self-pay | Admitting: *Deleted

## 2023-04-10 NOTE — Telephone Encounter (Signed)
04/10/2023  Name: Patty Castillo MRN: [id number] DOB: 03-Sep-1985  Reason for Call:  Transition of Care Hospital Discharge Call  Contact Status: Patient Contact Status: Message  Language assistant needed:          Follow-Up Questions:    Inocente Salles Postnatal Depression Scale:  In the Past 7 Days:    PHQ2-9 Depression Scale:     Discharge Follow-up:    Post-discharge interventions: NA  Salena Saner, RN 04/10/2023 11:23

## 2023-05-08 ENCOUNTER — Encounter (HOSPITAL_COMMUNITY): Payer: Self-pay | Admitting: Obstetrics
